# Patient Record
Sex: Female | Born: 1950 | Race: White | Hispanic: No | Marital: Married | Smoking: Current every day smoker
Health system: Southern US, Community
[De-identification: ages and names within clinical notes are randomized; demographics above are authoritative.]

## PROBLEM LIST (undated history)

## (undated) DIAGNOSIS — F419 Anxiety disorder, unspecified: Secondary | ICD-10-CM

## (undated) DIAGNOSIS — M069 Rheumatoid arthritis, unspecified: Secondary | ICD-10-CM

## (undated) DIAGNOSIS — F32A Depression, unspecified: Secondary | ICD-10-CM

## (undated) DIAGNOSIS — F329 Major depressive disorder, single episode, unspecified: Secondary | ICD-10-CM

## (undated) DIAGNOSIS — I83003 Varicose veins of unspecified lower extremity with ulcer of ankle: Secondary | ICD-10-CM

## (undated) DIAGNOSIS — I10 Essential (primary) hypertension: Secondary | ICD-10-CM

## (undated) DIAGNOSIS — J45909 Unspecified asthma, uncomplicated: Secondary | ICD-10-CM

## (undated) HISTORY — PX: TUBAL LIGATION: SHX77

## (undated) HISTORY — PX: TONSILLECTOMY: SUR1361

## (undated) HISTORY — PX: CARDIAC CATHETERIZATION: SHX172

## (undated) HISTORY — PX: APPENDECTOMY: SHX54

## (undated) HISTORY — PX: VENOUS ABLATION: SHX2656

---
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
## 2007-05-10 HISTORY — PX: COLONOSCOPY: SHX174

## 2009-09-06 HISTORY — PX: COLONOSCOPY: SHX174

## 2019-10-10 ENCOUNTER — Emergency Department (HOSPITAL_COMMUNITY): Payer: Medicare Other

## 2019-10-10 ENCOUNTER — Inpatient Hospital Stay (HOSPITAL_COMMUNITY): Payer: Medicare Other

## 2019-10-10 ENCOUNTER — Other Ambulatory Visit: Payer: Self-pay

## 2019-10-10 ENCOUNTER — Encounter (HOSPITAL_COMMUNITY): Payer: Self-pay | Admitting: Emergency Medicine

## 2019-10-10 ENCOUNTER — Inpatient Hospital Stay (HOSPITAL_COMMUNITY)
Admission: EM | Admit: 2019-10-10 | Discharge: 2019-11-07 | DRG: 871 | Disposition: E | Payer: Medicare Other | Attending: Internal Medicine | Admitting: Internal Medicine

## 2019-10-10 DIAGNOSIS — R627 Adult failure to thrive: Secondary | ICD-10-CM | POA: Diagnosis not present

## 2019-10-10 DIAGNOSIS — Z681 Body mass index (BMI) 19 or less, adult: Secondary | ICD-10-CM

## 2019-10-10 DIAGNOSIS — R400 Somnolence: Secondary | ICD-10-CM | POA: Diagnosis present

## 2019-10-10 DIAGNOSIS — E611 Iron deficiency: Secondary | ICD-10-CM | POA: Diagnosis present

## 2019-10-10 DIAGNOSIS — I5031 Acute diastolic (congestive) heart failure: Secondary | ICD-10-CM | POA: Diagnosis present

## 2019-10-10 DIAGNOSIS — M79606 Pain in leg, unspecified: Secondary | ICD-10-CM

## 2019-10-10 DIAGNOSIS — I110 Hypertensive heart disease with heart failure: Secondary | ICD-10-CM | POA: Diagnosis present

## 2019-10-10 DIAGNOSIS — M069 Rheumatoid arthritis, unspecified: Secondary | ICD-10-CM | POA: Diagnosis present

## 2019-10-10 DIAGNOSIS — M79605 Pain in left leg: Secondary | ICD-10-CM

## 2019-10-10 DIAGNOSIS — A419 Sepsis, unspecified organism: Secondary | ICD-10-CM | POA: Diagnosis not present

## 2019-10-10 DIAGNOSIS — L97929 Non-pressure chronic ulcer of unspecified part of left lower leg with unspecified severity: Secondary | ICD-10-CM | POA: Diagnosis present

## 2019-10-10 DIAGNOSIS — Z20822 Contact with and (suspected) exposure to covid-19: Secondary | ICD-10-CM | POA: Diagnosis present

## 2019-10-10 DIAGNOSIS — L03115 Cellulitis of right lower limb: Secondary | ICD-10-CM | POA: Diagnosis present

## 2019-10-10 DIAGNOSIS — G8929 Other chronic pain: Secondary | ICD-10-CM | POA: Diagnosis present

## 2019-10-10 DIAGNOSIS — F329 Major depressive disorder, single episode, unspecified: Secondary | ICD-10-CM | POA: Diagnosis present

## 2019-10-10 DIAGNOSIS — E785 Hyperlipidemia, unspecified: Secondary | ICD-10-CM | POA: Diagnosis present

## 2019-10-10 DIAGNOSIS — Z515 Encounter for palliative care: Secondary | ICD-10-CM | POA: Diagnosis not present

## 2019-10-10 DIAGNOSIS — J690 Pneumonitis due to inhalation of food and vomit: Secondary | ICD-10-CM | POA: Diagnosis present

## 2019-10-10 DIAGNOSIS — R0603 Acute respiratory distress: Secondary | ICD-10-CM

## 2019-10-10 DIAGNOSIS — Z79899 Other long term (current) drug therapy: Secondary | ICD-10-CM

## 2019-10-10 DIAGNOSIS — K921 Melena: Secondary | ICD-10-CM | POA: Diagnosis not present

## 2019-10-10 DIAGNOSIS — J9601 Acute respiratory failure with hypoxia: Secondary | ICD-10-CM | POA: Diagnosis present

## 2019-10-10 DIAGNOSIS — I361 Nonrheumatic tricuspid (valve) insufficiency: Secondary | ICD-10-CM | POA: Diagnosis not present

## 2019-10-10 DIAGNOSIS — K922 Gastrointestinal hemorrhage, unspecified: Secondary | ICD-10-CM | POA: Insufficient documentation

## 2019-10-10 DIAGNOSIS — J441 Chronic obstructive pulmonary disease with (acute) exacerbation: Secondary | ICD-10-CM | POA: Diagnosis not present

## 2019-10-10 DIAGNOSIS — E43 Unspecified severe protein-calorie malnutrition: Secondary | ICD-10-CM | POA: Diagnosis present

## 2019-10-10 DIAGNOSIS — R6510 Systemic inflammatory response syndrome (SIRS) of non-infectious origin without acute organ dysfunction: Secondary | ICD-10-CM

## 2019-10-10 DIAGNOSIS — D473 Essential (hemorrhagic) thrombocythemia: Secondary | ICD-10-CM | POA: Diagnosis not present

## 2019-10-10 DIAGNOSIS — E0590 Thyrotoxicosis, unspecified without thyrotoxic crisis or storm: Secondary | ICD-10-CM | POA: Diagnosis present

## 2019-10-10 DIAGNOSIS — K5790 Diverticulosis of intestine, part unspecified, without perforation or abscess without bleeding: Secondary | ICD-10-CM | POA: Diagnosis present

## 2019-10-10 DIAGNOSIS — I2781 Cor pulmonale (chronic): Secondary | ICD-10-CM | POA: Diagnosis present

## 2019-10-10 DIAGNOSIS — R64 Cachexia: Secondary | ICD-10-CM | POA: Diagnosis present

## 2019-10-10 DIAGNOSIS — Z66 Do not resuscitate: Secondary | ICD-10-CM | POA: Diagnosis not present

## 2019-10-10 DIAGNOSIS — F17210 Nicotine dependence, cigarettes, uncomplicated: Secondary | ICD-10-CM | POA: Diagnosis present

## 2019-10-10 DIAGNOSIS — Z86010 Personal history of colonic polyps: Secondary | ICD-10-CM

## 2019-10-10 DIAGNOSIS — J181 Lobar pneumonia, unspecified organism: Secondary | ICD-10-CM | POA: Diagnosis not present

## 2019-10-10 DIAGNOSIS — D62 Acute posthemorrhagic anemia: Secondary | ICD-10-CM | POA: Diagnosis present

## 2019-10-10 DIAGNOSIS — D75839 Thrombocytosis, unspecified: Secondary | ICD-10-CM

## 2019-10-10 DIAGNOSIS — Z800 Family history of malignant neoplasm of digestive organs: Secondary | ICD-10-CM | POA: Diagnosis not present

## 2019-10-10 DIAGNOSIS — R54 Age-related physical debility: Secondary | ICD-10-CM | POA: Diagnosis present

## 2019-10-10 DIAGNOSIS — Z7189 Other specified counseling: Secondary | ICD-10-CM | POA: Diagnosis not present

## 2019-10-10 HISTORY — DX: Varicose veins of unspecified lower extremity with ulcer of ankle: I83.003

## 2019-10-10 HISTORY — DX: Essential (primary) hypertension: I10

## 2019-10-10 HISTORY — DX: Depression, unspecified: F32.A

## 2019-10-10 HISTORY — DX: Rheumatoid arthritis, unspecified: M06.9

## 2019-10-10 HISTORY — DX: Anxiety disorder, unspecified: F41.9

## 2019-10-10 HISTORY — DX: Unspecified asthma, uncomplicated: J45.909

## 2019-10-10 LAB — BASIC METABOLIC PANEL
Anion gap: 11 (ref 5–15)
BUN: 27 mg/dL — ABNORMAL HIGH (ref 8–23)
CO2: 24 mmol/L (ref 22–32)
Calcium: 8.1 mg/dL — ABNORMAL LOW (ref 8.9–10.3)
Chloride: 104 mmol/L (ref 98–111)
Creatinine, Ser: 0.34 mg/dL — ABNORMAL LOW (ref 0.44–1.00)
GFR calc Af Amer: >60 mL/min (ref >60)
GFR calc non Af Amer: >60 mL/min (ref >60)
Glucose, Bld: 87 mg/dL (ref 70–99)
Potassium: 3.7 mmol/L (ref 3.5–5.1)
Sodium: 139 mmol/L (ref 135–145)

## 2019-10-10 LAB — CBC WITH DIFFERENTIAL/PLATELET
Abs Immature Granulocytes: 0.11 10*3/uL — ABNORMAL HIGH (ref 0.00–0.07)
Basophils Absolute: 0 10*3/uL (ref 0.0–0.1)
Basophils Relative: 0 %
Eosinophils Absolute: 0 10*3/uL (ref 0.0–0.5)
Eosinophils Relative: 0 %
HCT: 20.5 % — ABNORMAL LOW (ref 36.0–46.0)
Hemoglobin: 6.9 g/dL — CL (ref 12.0–15.0)
Immature Granulocytes: 1 %
Lymphocytes Relative: 3 %
Lymphs Abs: 0.7 10*3/uL (ref 0.7–4.0)
MCH: 31.8 pg (ref 26.0–34.0)
MCHC: 33.7 g/dL (ref 30.0–36.0)
MCV: 94.5 fL (ref 80.0–100.0)
Monocytes Absolute: 0.4 10*3/uL (ref 0.1–1.0)
Monocytes Relative: 2 %
Neutro Abs: 18.9 10*3/uL — ABNORMAL HIGH (ref 1.7–7.7)
Neutrophils Relative %: 94 %
Platelets: 472 10*3/uL — ABNORMAL HIGH (ref 150–400)
RBC: 2.17 MIL/uL — ABNORMAL LOW (ref 3.87–5.11)
RDW: 16.2 % — ABNORMAL HIGH (ref 11.5–15.5)
WBC: 20.2 10*3/uL — ABNORMAL HIGH (ref 4.0–10.5)
nRBC: 0 % (ref 0.0–0.2)

## 2019-10-10 LAB — SARS CORONAVIRUS 2 BY RT PCR (HOSPITAL ORDER, PERFORMED IN ~~LOC~~ HOSPITAL LAB): SARS Coronavirus 2: NEGATIVE

## 2019-10-10 LAB — URINALYSIS, COMPLETE (UACMP) WITH MICROSCOPIC
Bilirubin Urine: NEGATIVE
Glucose, UA: NEGATIVE mg/dL
Hgb urine dipstick: NEGATIVE
Ketones, ur: NEGATIVE mg/dL
Leukocytes,Ua: NEGATIVE
Nitrite: POSITIVE — AB
Protein, ur: NEGATIVE mg/dL
Specific Gravity, Urine: 1.023 (ref 1.005–1.030)
pH: 5 (ref 5.0–8.0)

## 2019-10-10 LAB — T4, FREE: Free T4: 1.2 ng/dL — ABNORMAL HIGH (ref 0.61–1.12)

## 2019-10-10 LAB — PROTIME-INR
INR: 1.2 (ref 0.8–1.2)
Prothrombin Time: 14.8 seconds (ref 11.4–15.2)

## 2019-10-10 LAB — HEPATIC FUNCTION PANEL
ALT: 32 U/L (ref 0–44)
AST: 34 U/L (ref 15–41)
Albumin: 2.4 g/dL — ABNORMAL LOW (ref 3.5–5.0)
Alkaline Phosphatase: 92 U/L (ref 38–126)
Bilirubin, Direct: 0.2 mg/dL (ref 0.0–0.2)
Indirect Bilirubin: 0.5 mg/dL (ref 0.3–0.9)
Total Bilirubin: 0.7 mg/dL (ref 0.3–1.2)
Total Protein: 5.3 g/dL — ABNORMAL LOW (ref 6.5–8.1)

## 2019-10-10 LAB — PROCALCITONIN: Procalcitonin: 0.16 ng/mL

## 2019-10-10 LAB — PREPARE RBC (CROSSMATCH)

## 2019-10-10 LAB — FOLATE: Folate: 13.4 ng/mL (ref >5.9)

## 2019-10-10 LAB — VITAMIN B12: Vitamin B-12: >7500 pg/mL — ABNORMAL HIGH (ref 180–914)

## 2019-10-10 LAB — BRAIN NATRIURETIC PEPTIDE: B Natriuretic Peptide: 702 pg/mL — ABNORMAL HIGH (ref 0.0–100.0)

## 2019-10-10 LAB — MRSA PCR SCREENING: MRSA by PCR: NEGATIVE

## 2019-10-10 LAB — IRON AND TIBC
Iron: 6 ug/dL — ABNORMAL LOW (ref 28–170)
Saturation Ratios: 3 % — ABNORMAL LOW (ref 10.4–31.8)
TIBC: 222 ug/dL — ABNORMAL LOW (ref 250–450)
UIBC: 216 ug/dL

## 2019-10-10 LAB — ABO/RH: ABO/RH(D): O POS

## 2019-10-10 LAB — APTT: aPTT: 40 seconds — ABNORMAL HIGH (ref 24–36)

## 2019-10-10 LAB — FERRITIN: Ferritin: 47 ng/mL (ref 11–307)

## 2019-10-10 LAB — POC OCCULT BLOOD, ED: Fecal Occult Bld: POSITIVE — AB

## 2019-10-10 LAB — TSH: TSH: 0.071 u[IU]/mL — ABNORMAL LOW (ref 0.350–4.500)

## 2019-10-10 LAB — LACTIC ACID, PLASMA: Lactic Acid, Venous: 1.4 mmol/L (ref 0.5–1.9)

## 2019-10-10 MED ORDER — SODIUM CHLORIDE 0.9% IV SOLUTION: Freq: Once | INTRAVENOUS | Status: AC

## 2019-10-10 MED ORDER — SODIUM CHLORIDE 0.9 % IV SOLN
80.0000 mg | Freq: Once | INTRAVENOUS | Status: AC
  Administered 2019-10-10: 80 mg via INTRAVENOUS
  Filled 2019-10-10: qty 80

## 2019-10-10 MED ORDER — VANCOMYCIN HCL 500 MG/100ML IV SOLN
500.0000 mg | INTRAVENOUS | Status: DC
  Filled 2019-10-10 (×4): qty 100

## 2019-10-10 MED ORDER — ONDANSETRON HCL 4 MG/2ML IJ SOLN: 4.0000 mg | Freq: Four times a day (QID) | INTRAMUSCULAR | Status: DC | PRN

## 2019-10-10 MED ORDER — PANTOPRAZOLE SODIUM 40 MG IV SOLR
40.0000 mg | Freq: Two times a day (BID) | INTRAVENOUS | Status: DC
  Filled 2019-10-10: qty 40

## 2019-10-10 MED ORDER — LACTATED RINGERS IV BOLUS
1000.0000 mL | Freq: Once | INTRAVENOUS | Status: AC
  Administered 2019-10-10: 1000 mL via INTRAVENOUS

## 2019-10-10 MED ORDER — ACETAMINOPHEN 650 MG RE SUPP
650.0000 mg | Freq: Four times a day (QID) | RECTAL | Status: DC | PRN
  Administered 2019-10-13 – 2019-10-15 (×4): 650 mg via RECTAL
  Filled 2019-10-10 (×4): qty 1

## 2019-10-10 MED ORDER — HYDROCODONE-ACETAMINOPHEN 5-325 MG PO TABS
1.0000 | ORAL_TABLET | ORAL | Status: DC | PRN
  Administered 2019-10-10 – 2019-10-13 (×11): 1 via ORAL
  Filled 2019-10-10 (×11): qty 1

## 2019-10-10 MED ORDER — SODIUM CHLORIDE 0.9 % IV BOLUS
1000.0000 mL | Freq: Once | INTRAVENOUS | Status: AC
  Administered 2019-10-10: 1000 mL via INTRAVENOUS

## 2019-10-10 MED ORDER — POTASSIUM CHLORIDE IN NACL 20-0.9 MEQ/L-% IV SOLN
INTRAVENOUS | Status: DC
  Filled 2019-10-10: qty 1000

## 2019-10-10 MED ORDER — SODIUM CHLORIDE 0.9 % IV SOLN: 10.0000 mL/h | Freq: Once | INTRAVENOUS | Status: DC

## 2019-10-10 MED ORDER — VANCOMYCIN HCL IN DEXTROSE 1-5 GM/200ML-% IV SOLN
1000.0000 mg | Freq: Once | INTRAVENOUS | Status: AC
  Administered 2019-10-10: 1000 mg via INTRAVENOUS
  Filled 2019-10-10: qty 200

## 2019-10-10 MED ORDER — SODIUM CHLORIDE 0.9 % IV SOLN
8.0000 mg/h | INTRAVENOUS | Status: AC
  Administered 2019-10-10 – 2019-10-13 (×7): 8 mg/h via INTRAVENOUS
  Filled 2019-10-10 (×10): qty 80

## 2019-10-10 MED ORDER — ACETAMINOPHEN 325 MG PO TABS: 650.0000 mg | ORAL_TABLET | Freq: Four times a day (QID) | ORAL | Status: DC | PRN

## 2019-10-10 MED ORDER — SODIUM CHLORIDE 0.9 % IV SOLN
2.0000 g | Freq: Once | INTRAVENOUS | Status: AC
  Administered 2019-10-10: 2 g via INTRAVENOUS
  Filled 2019-10-10: qty 2

## 2019-10-10 MED ORDER — ONDANSETRON HCL 4 MG PO TABS: 4.0000 mg | ORAL_TABLET | Freq: Four times a day (QID) | ORAL | Status: DC | PRN

## 2019-10-10 NOTE — ED Notes (Signed)
Pt transported to xray 

## 2019-10-10 NOTE — Consult Note (Signed)
[facility] Nurse wound consult note Consultation was completed by review of records, images and assistance from the bedside nurse/clinical staff.  Reason for Consult: leg wound  Wound type: full thickness ulceration LLE, appears chronic Pressure Injury POA: NA Measurement: see nursing flow sheet  Wound [id number], non granular, minimal slough at 11 o'clock. Tendon like structure in the wound bed    Drainage (amount, consistency, odor) moderate on dressing in image appears thick, yellow Periwound: intact, epibole of the wound edges Dressing procedure/placement/frequency: Clean wound with saline, pat dry Cut to fit silver hydrofiber and place in wound bed Top with ABD pads, secure with kerlix and ACE wrap with no tension   Re consult if needed, will not follow at this time. Thanks  Norah Devin [employer], RN,CWOCN, CNS, [place] [phone])

## 2019-10-10 NOTE — H&P (Signed)
History and Physical  [place] Orcutt [id number] DOB: May 26, 1950 DOA: 11/06/2019   PCP: Josem Kaufmann, MD   Patient coming from: Home  Chief Complaint: generalized weakness  HPI:  Sheri Espinoza is a 69 y.o. female with medical history of anxiety/depression, rheumatoid arthritis, diverticulosis, hypertension, COPD, depression presenting with failure to thrive symptoms for the past 2 weeks.  History is supplemented from the patient's spouse at the bedside.  He states that the patient has had fatigue, decline in functional status, decreased oral intake for the better part of the month.  She has lost 15 pounds in the past month.  He states that the patient previously was able to perform all her ADLs without any assistive devices.  However in the past 2 weeks, she has had a significant functional decline resulting in requiring assistance with her activities of daily living and making transfers.  The patient has been having loose stools that has been melanotic.  Apparently, the patient has had a history of loose stools in the past.  It is unclear the duration of her melanotic stools.  There is no hematochezia.  There is no history of fevers, chills, chest pain, shortness breath, coughing, hemoptysis, abdominal pain, dysuria, hematuria.  However, the patient has had decreased oral intake and unsteady gait and generalized weakness.  There is no headaches, visual disturbance, syncope.  She has not been placed on any antibiotics recently.  She takes methotrexate every Friday for her rheumatoid arthritis.  She continues to smoke 1 pack/day with a 40-pack-year history.  According to the patient's spouse, patient has not been on any new medications although she does take Imodium on a as needed basis. The patient's family called EMS on the evening of 6-21.  However, the patient refused to go to the emergency department.  She went to see her PCP on the morning of 10/14/2019.  Routine blood work was performed and  showed WBC 20,000 and hemoglobin 7.  The patient was contacted and directed to go to the emergency department. In the emergency department, the patient was afebrile with soft blood pressures.  Systolic blood pressures were in the mid 90s.  Oxygen saturation was 98% on 4 L.  Chest x-ray showed increased interstitial markings.  BMP showed sodium 139, potassium 3.7, CO2 24, serum creatinine 0.34.  WBC 20.0, hemoglobin 6.9, platelets 172,000.  Hemoccult was positive with brown/dark stool.  EKG shows sinus rhythm with nonspecific T wave changes.  Assessment/Plan: SIRS -Concerned about underlying infection -Follow blood cultures -Check lactic acid -Check procalcitonin -Obtain UA and urine culture -Start empiric vancomycin and cefepime -continue IVF  Acute blood loss anemia -No prior records to clarify the patient's hemoglobin baseline -GI consult -Transfuse PRBC--1 additional unit in addition to the 1 unit ordered in the ED -Continue Protonix drip -Check coags  Acute respiratory failure with hypoxia -due to underlying COPD, possible pulmonary edema -CT chest -on 4 LNC  Failure to thrive -Serum 123456 -Folic acid -TSH -PT eval  Left lower extremity ulceration -Wound care consult -See pictures below  Thrombocytosis -Due to combination of acute phase reaction as well as likely iron deficiency -Check iron studies  Rheumatoid arthritis -Holding methotrexate in the setting of possible sepsis        Past Medical History:  Diagnosis Date  . Anxiety   . Asthma   . Depression   . Hypertension   . Rheumatoid arthritis ([place])   . Venous ulcer of ankle ([facility])    Past  Surgical History:  Procedure Laterality Date  . APPENDECTOMY    . CARDIAC CATHETERIZATION    . TONSILLECTOMY    . TUBAL LIGATION    . VENOUS ABLATION     Social History:  reports that she has been smoking. She has never used smokeless tobacco. She reports that she does not drink alcohol or use drugs.   Family  history--unobtainable due to patient's somnolence    Prior to Admission medications   Not on File    Review of Systems:  Constitutional:  No night sweats,, fatigue.  Head&Eyes: No headache.  No vision loss.  No eye pain or scotoma ENT:  No Difficulty swallowing,Tooth/dental problems,Sore throat,  No ear ache, post nasal drip,  Cardio-vascular:  No chest pain, Orthopnea, PND,dizziness, palpitations  GI:  No  abdominal pain, vomiting, loss of appetite, hematochezia, melena, heartburn, indigestion, Resp:  No shortness of breath with exertion or at rest. No cough. No coughing up of blood .No wheezing.No chest wall deformity  Skin:  no rash or lesions.  GU:  no dysuria, change in color of urine, no urgency or frequency. No flank pain.  Musculoskeletal:  No joint pain or swelling. No decreased range of motion. No back pain.  Psych:  No change in mood or affect. No depression or anxiety. Neurologic: No headache, no dysesthesia, no focal weakness, no vision loss. No syncope  Physical Exam: Vitals:   10/14/2019 1252 10/20/2019 1254 10/31/2019 1300 10/24/2019 1330  BP: 99/64  (!) 95/54 (!) 97/48  Pulse:  (!) 58 (!) 58 (!) 59  Resp: 15 15 13 16   Temp:      TempSrc:      SpO2:  92% 97% 97%  Weight:      Height:       General:  A&O x 3, NAD, nontoxic, pleasant/cooperative Head/Eye: No conjunctival hemorrhage, no icterus, [place]/AT, No nystagmus ENT:  No icterus,  No thrush, good dentition, no pharyngeal exudate Neck:  No masses, no lymphadenpathy, no bruits CV:  RRR, no rub, no gallop, no S3 Lung:  Diminished BS.  Bibasilar rales. No wheeze Abdomen: soft/NT, +BS, nondistended, no peritoneal signs Ext: No cyanosis, No rashes, No petechiae, No lymphangitis, 2 + LE edema; LLE ulcer without necrosis Neuro: CNII-XII intact, strength 4/5 in bilateral upper and lower extremities, no dysmetria          Labs on Admission:  Basic Metabolic Panel: Recent Labs  Lab 10/09/2019 1042  NA  139  K 3.7  CL 104  CO2 24  GLUCOSE 87  BUN 27*  CREATININE 0.34*  CALCIUM 8.1*   Liver Function Tests: Recent Labs  Lab 10/25/2019 1042  AST 34  ALT 32  ALKPHOS 92  BILITOT 0.7  PROT 5.3*  ALBUMIN 2.4*   No results for input(s): LIPASE, AMYLASE in the last 168 hours. No results for input(s): AMMONIA in the last 168 hours. CBC: Recent Labs  Lab 10/11/2019 1042  WBC 20.2*  NEUTROABS 18.9*  HGB 6.9*  HCT 20.5*  MCV 94.5  PLT 472*   Coagulation Profile: No results for input(s): INR, PROTIME in the last 168 hours. Cardiac Enzymes: No results for input(s): CKTOTAL, CKMB, CKMBINDEX, TROPONINI in the last 168 hours. BNP: Invalid input(s): POCBNP CBG: No results for input(s): GLUCAP in the last 168 hours. Urine analysis: No results found for: COLORURINE, APPEARANCEUR, LABSPEC, PHURINE, GLUCOSEU, HGBUR, BILIRUBINUR, KETONESUR, PROTEINUR, UROBILINOGEN, NITRITE, LEUKOCYTESUR Sepsis Labs: @LABRCNTIP (procalcitonin:4,lacticidven:4) ) Recent Results (from the past 240 hour(s))  Culture, blood (single)  w Reflex to ID Panel     Status: None (Preliminary result)   Collection Time: 11/01/2019 10:59 AM   Specimen: BLOOD RIGHT FOREARM  Result Value Ref Range Status   Specimen Description BLOOD RIGHT FOREARM  Final   Special Requests   Final    BOTTLES DRAWN AEROBIC AND ANAEROBIC Blood Culture adequate volume Performed at [facility], [address], [place], [place] [address]    Culture PENDING  Incomplete   Report Status PENDING  Incomplete     Radiological Exams on Admission: DG Chest 2 View  Result Date: 10/08/2019 CLINICAL DATA:  Weakness. EXAM: CHEST - 2 VIEW COMPARISON:  None. FINDINGS: The heart size and mediastinal contours are within normal limits. Atherosclerosis of thoracic aorta is noted. No pneumothorax is noted. Central pulmonary vascular congestion is noted with possible bilateral pulmonary edema. Small pleural effusions are noted. The visualized skeletal structures  are unremarkable. IMPRESSION: Central pulmonary vascular congestion may be present with possible bilateral pulmonary edema and small pleural effusions. Electronically Signed   By: Marijo Conception M.D.   On: 10/11/2019 11:50    EKG: Independently reviewed. Sinus, nonspecific TWI    Time spent:70 minutes Code Status:   FULL Family Communication:  No Family at bedside Disposition Plan: expect 2-3 day hospitalization Consults called: GI DVT Prophylaxis:  SCDs  Orson Eva, DO  [facility] Pager [phone]  If 7PM-7AM, please contact night-coverage [email or web address] Password [email or web address] 11/03/2019, 1:41 PM

## 2019-10-10 NOTE — ED Notes (Signed)
Date and time results received: 11/01/2019 1131 (use smartphrase ".now" to insert current time)  Test: Hemoglobin Critical Value: 6.9 Name of Provider Notified: Dr Sedonia Small  Orders Received? Or Actions Taken?: NA

## 2019-10-10 NOTE — Consult Note (Addendum)
Referring Provider: Orson Eva, MD Primary Care Physician:  Josem Kaufmann, MD Primary Gastroenterologist:  Dr. Roni Bread [facility]  Reason for Consultation:  Acute blood loss anemia, melena  HPI: Sheri Espinoza is a 69 y.o. female with history of rheumatoid arthritis, diverticulosis, COPD, hypertension, chronic leg wound presented to the emergency department with increased weakness,  poor oral intake, abnormal labs.   Patient does provide some of her history however husband assists with elaboration.  2 weeks ago patient was taking care of all ADLs, cooking, driving.  However over the past couple weeks she has had a steady significant decline requiring 24-hour assistance especially the last several days.  Complains of generalized weakness, poor oral intake, weight loss, dehydration.  She was seen by PCP today, referred to the ER after noted to have leukocytosis and anemia.  In the ED: Pulse of 69, BP 100/53, afebrile, O2 sats initially 94% on room air.  White blood cell count 20,200, hemoglobin 6.9, hematocrit 20.5, MCV 94.5, platelets 172,000.  BUN 27, creatinine 0.34, albumin 2.4 otherwise LFTs normal, BNP 702.  Chest x-ray showed central pulmonary vascular congestion may be present with possible bilateral pulmonary edema and small pleural effusions.  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  Has been evaluated by GI at [facility] remotely.  She denies any abdominal pain, rectal bleeding, constipation, vomiting, dysphagia.  Appetite has been poor.  She has used intermittent Aleve.  Denies any aspirin powders.  History of adenomatous colon polyps removed, diverticulosis on previous colonoscopy by Dr. Posey Pronto around 2009.  She had a colonoscopy at [facility] in May 2011 that showed sigmoid diverticulosis, random colon biopsies were negative for microscopic colitis.  Celiac serologies were negative.  COVID negative.   Prior to Admission medications   Medication Sig Start  Date End Date Taking? Authorizing Provider  fexofenadine (ALLEGRA) 180 MG tablet Take 1 tablet by mouth daily. 04/26/09  Yes [provider]  ADVAIR HFA 230-21 MCG/ACT inhaler Inhale 2 puffs into the lungs 2 (two) times daily. 09/09/19   [provider]  albuterol (VENTOLIN HFA) 108 (90 Base) MCG/ACT inhaler Inhale 2 puffs into the lungs daily. Prn for wheezing    [provider]  ALPRAZolam (XANAX) 0.5 MG tablet Take 0.5 mg by mouth 3 (three) times daily as needed. 09/26/19   [provider]  montelukast (SINGULAIR) 10 MG tablet Take 10 mg by mouth daily. 09/09/19   [provider]  PARoxetine (PAXIL) 20 MG tablet Take 20 mg by mouth daily. 09/26/19   [provider]  Avapro 150 mg daily Hydrochlorothiazide 25 mg half a tablet daily Crestor 10 mg every other day Vicodin 10/3 and 25 mg 4 times daily as needed Aleve 220 mg twice daily Methotrexate 25 mg injection per week Prednisone 5 mg 2 daily Vitamin D3 50,000 units/week B12 123456 mcg daily Folic acid 1 mg daily Calcium 500 mg plus vitamin D twice daily Simponi infusion Coq10 100 mg daily Biotin 5000 mcg daily Women's One-A-Day daily    Allergies as of 10/31/2019  . (Not on File)    Past Medical History:  Diagnosis Date  . Anxiety   . Asthma   . Depression   . Hypertension   . Rheumatoid arthritis ([place])   . Venous ulcer of ankle [facility])     Past Surgical History:  Procedure Laterality Date  . APPENDECTOMY    . CARDIAC CATHETERIZATION    . COLONOSCOPY  2009   Dr. Posey Pronto,  h/o adenomatous colon polyps, diverticulosis per epic  . TONSILLECTOMY    . TUBAL LIGATION    . VENOUS ABLATION      Family History  Problem Relation Age of Onset  . Colon cancer Paternal Grandmother     Social History   Socioeconomic History  . Marital status: Married    Spouse name: Not on file  . Number of children: Not on file  . Years of education: Not on file  . Highest education level:  Not on file  Occupational History  . Not on file  Tobacco Use  . Smoking status: Current Every Day Smoker  . Smokeless tobacco: Never Used  Substance and Sexual Activity  . Alcohol use: Never  . Drug use: Never  . Sexual activity: Not on file  Other Topics Concern  . Not on file  Social History Narrative  . Not on file   Social Determinants of Health   Financial Resource Strain:   . Difficulty of Paying Living Expenses:   Food Insecurity:   . Worried About Charity fundraiser in the Last Year:   . Arboriculturist in the Last Year:   Transportation Needs:   . Film/video editor (Medical):   Marland Kitchen Lack of Transportation (Non-Medical):   Physical Activity:   . Days of Exercise per Week:   . Minutes of Exercise per Session:   Stress:   . Feeling of Stress :   Social Connections:   . Frequency of Communication with Friends and Family:   . Frequency of Social Gatherings with Friends and Family:   . Attends Religious Services:   . Active Member of Clubs or Organizations:   . Attends Archivist Meetings:   Marland Kitchen Marital Status:   Intimate Partner Violence:   . Fear of Current or Ex-Partner:   . Emotionally Abused:   Marland Kitchen Physically Abused:   . Sexually Abused:      ROS:  General: Negative for fever, chills.  See HPI Eyes: Negative for vision changes.  ENT: Negative for hoarseness, difficulty swallowing , nasal congestion. CV: Negative for chest pain, angina, palpitations, dyspnea on exertion, peripheral edema.  Respiratory: Negative for dyspnea at rest, dyspnea on exertion, positive cough, sputum, wheezing.  GI: See history of present illness. GU:  Negative for dysuria, hematuria, urinary incontinence, urinary frequency, nocturnal urination.  MS: Negative for  low back pain.  Positive for joint pain Derm: Negative for rash or itching.  Chronic large ulceration left lower leg Neuro: Negative for weakness, abnormal sensation, seizure, frequent headaches, memory loss,  confusion.  Psych: Negative for anxiety, depression, suicidal ideation, hallucinations.  Endo: Negative for unusual weight change.  Heme: Negative for bruising or bleeding. Allergy: Negative for rash or hives.       Physical Examination: Vital signs in last 24 hours: Temp:  [98.7 F (37.1 C)] 98.7 F (37.1 C) (06/03 1025) Pulse Rate:  [58-69] 59 (06/03 1330) Resp:  [13-16] 16 (06/03 1330) BP: (94-101)/(48-64) 97/48 (06/03 1330) SpO2:  [92 %-100 %] 97 % (06/03 1330) Weight:  [40.6 kg] 40.6 kg (06/03 1024)    General: Thin frail appearing female in no acute distress.  Husband at bedside.  Coughing noted throughout exam Head: Normocephalic, atraumatic.   Eyes: Conjunctiva pink, no icterus. Mouth: Oropharyngeal mucosa moist and pink , no lesions erythema or exudate. Neck: Supple without thyromegaly, masses, or lymphadenopathy.  Lungs: Diffuse rhonchi Heart: Regular rate and rhythm, no murmurs rubs or gallops.  Abdomen:  Bowel sounds are hyperactive, nontender, nondistended, no hepatosplenomegaly or masses, no abdominal bruits or    hernia , no rebound or guarding.   Rectal: Not performed Extremities: No lower extremity edema, clubbing.  Large ulceration noted left lower extremity Neuro: Alert and oriented x 4 , grossly normal neurologically.  Skin: Warm and dry, no rash or jaundice.   Psych: Alert and cooperative, normal mood and affect.        Intake/Output from previous day: No intake/output data recorded. Intake/Output this shift: Total I/O In: 1305.4 [IV Piggyback:1305.4] Out: -   Lab Results: CBC Recent Labs    10/09/2019 1042  WBC 20.2*  HGB 6.9*  HCT 20.5*  MCV 94.5  PLT 472*   BMET Recent Labs    10/25/2019 1042  NA 139  K 3.7  CL 104  CO2 24  GLUCOSE 87  BUN 27*  CREATININE 0.34*  CALCIUM 8.1*   LFT Recent Labs    11/06/2019 1042  BILITOT 0.7  BILIDIR 0.2  IBILI 0.5  ALKPHOS 92  AST 34  ALT 32  PROT 5.3*  ALBUMIN 2.4*   TSH 0.071  Lab  Results  Component Value Date   IRON 6 (L) 10/27/2019   TIBC 222 (L) 10/13/2019   FERRITIN 47 10/09/2019   No results found for: VITAMINB12 Lab Results  Component Value Date   FOLATE 13.1 10/11/2019     Lipase No results for input(s): LIPASE in the last 72 hours.  PT/INR No results for input(s): LABPROT, INR in the last 72 hours.    Imaging Studies: DG Chest 2 View  Result Date: 10/14/2019 CLINICAL DATA:  Weakness. EXAM: CHEST - 2 VIEW COMPARISON:  None. FINDINGS: The heart size and mediastinal contours are within normal limits. Atherosclerosis of thoracic aorta is noted. No pneumothorax is noted. Central pulmonary vascular congestion is noted with possible bilateral pulmonary edema. Small pleural effusions are noted. The visualized skeletal structures are unremarkable. IMPRESSION: Central pulmonary vascular congestion may be present with possible bilateral pulmonary edema and small pleural effusions. Electronically Signed   By: Marijo Conception M.D.   On: 10/08/2019 11:50  [4 week]   Impression: 69 year old female presenting with 2-week history of quick decline with progressive weakness, poor appetite, weight loss and dehydration.  Patient was noted to have leukocytosis and anemia on outpatient labs and referred to the ED for further evaluation.  GI was consulted for acute blood loss anemia/melena.  Work-up concerning for underlying infection/SIRS.  Acute respiratory failure with hypoxia in setting of COPD, possible pulmonary edema, chest CT pending.  Anemia: Presented with hemoglobin of 6.9, normal MCV.  No baseline for comparison.  Iron/TIBC/saturations low with normal ferritin, folate.  B12 level pending.  Family reports recent black stools.  Patient has been taking Aleve a couple of times per day more recently.  She was heme positive today.  Patient's last colonoscopy in 2011 as outlined above.  She is unsure if she has ever had an upper endoscopy.  Given clinical presentation, we  need to rule out upper GI bleeding once patient is medically stable.  Plan: 1. Await chest CT findings. 2. Transfuse as needed. 3. Continue IV pantoprazole twice daily. 4. Would consider upper endoscopy once patient is medically stable and a candidate for sedation. 5. Clear liquid diet, n.p.o. after midnight.  We would like to thank you for the opportunity to participate in the care of [facility].  Laureen Ochs. Bernarda Caffey [facility] [phone] 6/3/20213:38 PM  LOS: 0 days

## 2019-10-10 NOTE — Progress Notes (Signed)
Pharmacy Antibiotic Note  Ruey Reigel is a 69 y.o. female admitted on 10/28/2019 with sepsis.  Pharmacy has been consulted for vancomycin dosing.  Plan:  Give vancomycin 1g  IV x1 dose, then vancomycin 500mg  IV q24h  Goal vancomycin trough range:   15-20  mcg/mL Pharmacy will continue to monitor renal function, vancomycin troughs as clinically appropriate,  cultures and patient progress.   Height: 4\' 11"  (149.9 cm) Weight: 40.6 kg (89 lb 9.6 oz) IBW/kg (Calculated) : 43.2  Temp (24hrs), Avg:98.7 F (37.1 C), Min:98.7 F (37.1 C), Max:98.7 F (37.1 C)  Recent Labs  Lab 10/29/2019 1042  WBC 20.2*  CREATININE 0.34*    Estimated Creatinine Clearance: 43.7 mL/min (A) (by C-G formula based on SCr of 0.34 mg/dL (L)).      Antimicrobials this admission: vancomycin 6/3 >>  cefepime 6/3 >> x1 dose in ED     Microbiology results: 6/3  [facility] x2:  6/3  UCx:   6/3 Resp PCR: SARS CoV-2 negative 6/3 MRSA PCR:    Thank you for allowing pharmacy to be a part of this patient's care.  Despina Pole 10/28/2019 2:06 PM

## 2019-10-10 NOTE — ED Notes (Signed)
ED TO INPATIENT HANDOFF REPORT  ED Nurse Name and Phone #: [phone]  S Name/Age/Gender Sheri Espinoza 69 y.o. female Room/Bed: APA16A/APA16A  Code Status   Code Status: Not on file  Home/SNF/Other Home Patient oriented to: self, place, time and situation Is this baseline? Yes   Triage Complete: Triage complete  Chief Complaint Sepsis due to undetermined organism [facility]) [A41.9]  Triage Note Pt sent from PCP for eval.  Increased weakness, not eating or drinking, and wound to her left lower leg. Labs are abnormal.    Allergies Not on File  Level of Care/Admitting Diagnosis ED Disposition    ED Disposition Condition [place]: [facility] [id number]  Level of Care: Stepdown [14]  Covid Evaluation: Asymptomatic Screening Protocol (No Symptoms)  Diagnosis: Sepsis due to undetermined organism [facility]) [id number]  Admitting Physician: TAT, DAVID Juvencus.Bun  Attending Physician: TAT, DAVID Juvencus.Bun  Estimated length of stay: past midnight tomorrow  Certification:: I certify this patient will need inpatient services for at least 2 midnights       B Medical/Surgery History Past Medical History:  Diagnosis Date  . Anxiety   . Asthma   . Depression   . Hypertension   . Rheumatoid arthritis ([place])   . Venous ulcer of ankle [facility])    Past Surgical History:  Procedure Laterality Date  . APPENDECTOMY    . CARDIAC CATHETERIZATION    . COLONOSCOPY  2009   Dr. Posey Pronto, h/o adenomatous colon polyps, diverticulosis per epic  . COLONOSCOPY  09/2009   [facility]: Sigmoid diverticulosis, random colon biopsies negative for microscopic colitis.  . TONSILLECTOMY    . TUBAL LIGATION    . VENOUS ABLATION       A IV Location/Drains/Wounds Patient Lines/Drains/Airways Status   Active Line/Drains/Airways    Name:   Placement date:   Placement time:   Site:   Days:   Peripheral IV 10/26/2019 Right Forearm   10/22/2019    1101    Forearm   less than 1   Peripheral IV  11/06/2019 Right Hand   10/21/2019    1258    Hand   less than 1          Intake/Output Last 24 hours  Intake/Output Summary (Last 24 hours) at 10/20/2019 1737 Last data filed at 10/17/2019 1547 Gross per 24 hour  Intake 2605.44 ml  Output --  Net 2605.44 ml    Labs/Imaging Results for orders placed or performed during the hospital encounter of 11/01/2019 (from the past 48 hour(s))  CBC with Differential     Status: Abnormal   Collection Time: 11/04/2019 10:42 AM  Result Value Ref Range   WBC 20.2 (H) 4.0 - 10.5 K/uL   RBC 2.17 (L) 3.87 - 5.11 MIL/uL   Hemoglobin 6.9 (LL) 12.0 - 15.0 g/dL    Comment: REPEATED TO VERIFY THIS CRITICAL RESULT HAS VERIFIED AND BEEN CALLED TO [name] BY LATISHA HENDERSON ON 06 03 2021 AT 1126, AND HAS BEEN READ BACK.     HCT 20.5 (L) 36.0 - 46.0 %   MCV 94.5 80.0 - 100.0 fL   MCH 31.8 26.0 - 34.0 pg   MCHC 33.7 30.0 - 36.0 g/dL   RDW 16.2 (H) 11.5 - 15.5 %   Platelets 472 (H) 150 - 400 K/uL   nRBC 0.0 0.0 - 0.2 %   Neutrophils Relative % 94 %   Neutro Abs 18.9 (H) 1.7 - 7.7 K/uL   Lymphocytes Relative 3 %  Lymphs Abs 0.7 0.7 - 4.0 K/uL   Monocytes Relative 2 %   Monocytes Absolute 0.4 0.1 - 1.0 K/uL   Eosinophils Relative 0 %   Eosinophils Absolute 0.0 0.0 - 0.5 K/uL   Basophils Relative 0 %   Basophils Absolute 0.0 0.0 - 0.1 K/uL   Immature Granulocytes 1 %   Abs Immature Granulocytes 0.11 (H) 0.00 - 0.07 K/uL    Comment: Performed at [facility], [address] [place], [place] [id number]  Basic metabolic panel     Status: Abnormal   Collection Time: 10/11/2019 10:42 AM  Result Value Ref Range   Sodium 139 135 - 145 mmol/L   Potassium 3.7 3.5 - 5.1 mmol/L   Chloride 104 98 - 111 mmol/L   CO2 24 22 - 32 mmol/L   Glucose, Bld 87 70 - 99 mg/dL    Comment: Glucose reference range applies only to samples taken after fasting for at least 8 hours.   BUN 27 (H) 8 - 23 mg/dL   Creatinine, Ser 0.34 (L) 0.44 - 1.00 mg/dL   Calcium 8.1 (L) 8.9 - 10.3  mg/dL   GFR calc non Af Amer >60 >60 mL/min   GFR calc Af Amer >60 >60 mL/min   Anion gap 11 5 - 15    Comment: Performed at [facility], [address] [place], [place] [address]  Type and screen [facility]     Status: None (Preliminary result)   Collection Time: 10/25/2019 10:42 AM  Result Value Ref Range   ABO/RH(D) O POS    Antibody Screen NEG    Sample Expiration 10/13/2019,2359    Unit Number [id number]    Blood Component Type RED CELLS,LR    Unit division 00    Status of Unit ISSUED    Transfusion Status OK TO TRANSFUSE    Crossmatch Result      Compatible Performed at [facility], [address], [place], [place] [address]    Unit Number [id number]    Blood Component Type RBC LR PHER1    Unit division 00    Status of Unit ALLOCATED    Transfusion Status OK TO TRANSFUSE    Crossmatch Result Compatible   Hepatic function panel     Status: Abnormal   Collection Time: 10/13/2019 10:42 AM  Result Value Ref Range   Total Protein 5.3 (L) 6.5 - 8.1 g/dL   Albumin 2.4 (L) 3.5 - 5.0 g/dL   AST 34 15 - 41 U/L   ALT 32 0 - 44 U/L   Alkaline Phosphatase 92 38 - 126 U/L   Total Bilirubin 0.7 0.3 - 1.2 mg/dL   Bilirubin, Direct 0.2 0.0 - 0.2 mg/dL   Indirect Bilirubin 0.5 0.3 - 0.9 mg/dL    Comment: Performed at [facility], [address] [place], [place] [address]  Brain natriuretic peptide     Status: Abnormal   Collection Time: 10/20/2019 10:42 AM  Result Value Ref Range   B Natriuretic Peptide 702.0 (H) 0.0 - 100.0 pg/mL    Comment: Performed at [facility], [address] [place], [place] [address]  ABO/Rh     Status: None   Collection Time: 11/05/2019 10:42 AM  Result Value Ref Range   ABO/RH(D)      [name] POS Performed at [facility], [address], [place], [place] [address]   Prepare RBC (crossmatch)     Status: None   Collection Time: 10/30/2019 10:42 AM  Result Value Ref  Range   Order Confirmation      ORDER PROCESSED BY BLOOD BANK Performed at [facility], [address], [place], [place] [address]   Culture, blood (single) w Reflex to ID Panel     Status: None (Preliminary result)   Collection Time: 11/02/2019 10:59 AM   Specimen: BLOOD RIGHT FOREARM  Result Value Ref Range   Specimen Description BLOOD RIGHT FOREARM    Special Requests      BOTTLES DRAWN AEROBIC AND ANAEROBIC Blood Culture adequate volume Performed at [facility], [address] [place], [place] [address]    Culture PENDING    Report Status PENDING   Prepare RBC (crossmatch)     Status: None   Collection Time: 10/22/2019 11:07 AM  Result Value Ref Range   Order Confirmation      ORDER PROCESSED BY BLOOD BANK Performed at [facility], [address] [place], [place] [address]   POC occult blood, ED     Status: Abnormal   Collection Time: 10/14/2019 11:52 AM  Result Value Ref Range   Fecal Occult Bld POSITIVE (A) NEGATIVE  SARS Coronavirus 2 by RT PCR (hospital order, performed in [place] hospital lab) Nasopharyngeal Nasopharyngeal Swab     Status: None   Collection Time: 10/26/2019  1:35 PM   Specimen: Nasopharyngeal Swab  Result Value Ref Range   SARS Coronavirus 2 NEGATIVE NEGATIVE    Comment: (NOTE) SARS-CoV-2 target nucleic acids are NOT DETECTED. The SARS-CoV-2 RNA is generally detectable in upper and lower respiratory specimens during the acute phase of infection. The lowest concentration of SARS-CoV-2 viral copies this assay can detect is 250 copies / mL. A negative result does not preclude SARS-CoV-2 infection and should not be used as the sole basis for treatment or other patient management decisions.  A negative result may occur with improper specimen collection / handling, submission of specimen other than nasopharyngeal swab, presence of viral mutation(s) within the areas targeted by this assay, and inadequate number of viral copies (<250 copies / mL). A negative result must be combined with clinical observations, patient history, and  epidemiological information. Fact Sheet for Patients:   StrictlyIdeas.no Fact Sheet for Healthcare Providers: BankingDealers.co.za This test is not yet approved or cleared  by the [place] FDA and has been authorized for detection and/or diagnosis of SARS-CoV-2 by FDA under an Emergency Use Authorization (EUA).  This EUA will remain in effect (meaning this test can be used) for the duration of the COVID-19 declaration under Section 564(b)(1) of the Act, 21 U.S.C. section 360bbb-3(b)(1), unless the authorization is terminated or revoked sooner. Performed at [facility], [address] [place],  [address]   Urinalysis, Complete w Microscopic     Status: Abnormal   Collection Time: 10/17/2019  1:36 PM  Result Value Ref Range   Color, Urine AMBER (A) YELLOW    Comment: BIOCHEMICALS MAY BE AFFECTED BY COLOR   APPearance HAZY (A) CLEAR   Specific Gravity, Urine 1.023 1.005 - 1.030   pH 5.0 5.0 - 8.0   Glucose, UA NEGATIVE NEGATIVE mg/dL   Hgb urine dipstick NEGATIVE NEGATIVE   Bilirubin Urine NEGATIVE NEGATIVE   Ketones, ur NEGATIVE NEGATIVE mg/dL   Protein, ur NEGATIVE NEGATIVE mg/dL   Nitrite POSITIVE (A) NEGATIVE   Leukocytes,Ua NEGATIVE NEGATIVE   RBC / HPF 0-5 0 - 5 RBC/hpf   WBC, UA 11-20 0 - 5 WBC/hpf   Bacteria, UA MANY (A) NONE SEEN   Squamous Epithelial /  LPF 0-5 0 - 5   Mucus PRESENT     Comment: Performed at [facility], [address], [place], [place] [address]  Procalcitonin - Baseline     Status: None   Collection Time: 11/05/2019  1:43 PM  Result Value Ref Range   Procalcitonin 0.16 ng/mL    Comment:        Interpretation: PCT (Procalcitonin) <= 0.5 ng/mL: Systemic infection (sepsis) is not likely. Local bacterial infection is possible. (NOTE)       Sepsis PCT Algorithm           Lower Respiratory Tract                                      Infection PCT Algorithm    ----------------------------      ----------------------------         PCT < 0.25 ng/mL                PCT < 0.10 ng/mL         Strongly encourage             Strongly discourage   discontinuation of antibiotics    initiation of antibiotics    ----------------------------     -----------------------------       PCT 0.25 - 0.50 ng/mL            PCT 0.10 - 0.25 ng/mL               OR       >80% decrease in PCT            Discourage initiation of                                            antibiotics      Encourage discontinuation           of antibiotics    ----------------------------     -----------------------------         PCT >= 0.50 ng/mL              PCT 0.26 - 0.50 ng/mL               AND        <80% decrease in PCT             Encourage initiation of                                             antibiotics       Encourage continuation           of antibiotics    ----------------------------     -----------------------------        PCT >= 0.50 ng/mL                  PCT > 0.50 ng/mL               AND         increase in PCT                  Strongly encourage  initiation of antibiotics    Strongly encourage escalation           of antibiotics                                     -----------------------------                                           PCT <= 0.25 ng/mL                                                 OR                                        > 80% decrease in PCT                                     Discontinue / Do not initiate                                             antibiotics Performed at [facility], [address], [place], [place] [address]   Protime-INR     Status: None   Collection Time: 10/09/2019  1:43 PM  Result Value Ref Range   Prothrombin Time 14.8 11.4 - 15.2 seconds   INR 1.2 0.8 - 1.2    Comment: (NOTE) INR goal varies based on device and disease states. Performed at [facility], [address], [place], [place] [address]   APTT      Status: Abnormal   Collection Time: 10/08/2019  1:43 PM  Result Value Ref Range   aPTT 40 (H) 24 - 36 seconds    Comment:        IF BASELINE aPTT IS ELEVATED, SUGGEST PATIENT RISK ASSESSMENT BE USED TO DETERMINE APPROPRIATE ANTICOAGULANT THERAPY. Performed at [facility], [address] [place], [place] [address]   Iron and TIBC     Status: Abnormal   Collection Time: 10/22/2019  1:43 PM  Result Value Ref Range   Iron 6 (L) 28 - 170 ug/dL   TIBC 222 (L) 250 - 450 ug/dL   Saturation Ratios 3 (L) 10.4 - 31.8 %   UIBC 216 ug/dL    Comment: Performed at [facility], [address], [place], [place] [address]  Ferritin     Status: None   Collection Time: 10/08/2019  1:43 PM  Result Value Ref Range   Ferritin 47 11 - 307 ng/mL    Comment: Performed at [facility], [address] [place], [place] [address]  Vitamin B12     Status: Abnormal   Collection Time: 11/06/2019  1:43 PM  Result Value Ref Range   Vitamin B-12 >7,500 (H) 180 - 914 pg/mL    Comment: RESULTS CONFIRMED BY MANUAL DILUTION Performed at [facility], [address] [place], [place] [address]   Lactic acid, plasma  Status: None   Collection Time: 11/01/2019  2:03 PM  Result Value Ref Range   Lactic Acid, Venous 1.4 0.5 - 1.9 mmol/L    Comment: Performed at [facility], [address] [place], [place] [address]  Folate     Status: None   Collection Time: 10/17/2019  2:03 PM  Result Value Ref Range   Folate 13.1 >5.9 ng/mL    Comment: Performed at [facility], [address] [place], [place] [address]  TSH     Status: Abnormal   Collection Time: 11/06/2019  2:03 PM  Result Value Ref Range   TSH 0.071 (L) 0.350 - 4.500 uIU/mL    Comment: Performed by a 3rd Generation assay with a functional sensitivity of <=0.01 uIU/mL. Performed at [facility], [address], [place], [place] [address]   T4, free     Status: Abnormal   Collection Time: 10/08/2019  2:03 PM  Result Value Ref Range   Free T4 1.20 (H) 0.61 - 1.12 ng/dL     Comment: (NOTE) Biotin ingestion may interfere with free T4 tests. If the results are inconsistent with the TSH level, previous test results, or the clinical presentation, then consider biotin interference. If needed, order repeat testing after stopping biotin. Performed at [facility], [address] [place], [place] [address]    DG Chest 2 View  Result Date: 10/13/2019 CLINICAL DATA:  Weakness. EXAM: CHEST - 2 VIEW COMPARISON:  None. FINDINGS: The heart size and mediastinal contours are within normal limits. Atherosclerosis of thoracic aorta is noted. No pneumothorax is noted. Central pulmonary vascular congestion is noted with possible bilateral pulmonary edema. Small pleural effusions are noted. The visualized skeletal structures are unremarkable. IMPRESSION: Central pulmonary vascular congestion may be present with possible bilateral pulmonary edema and small pleural effusions. Electronically Signed   By: Marijo Conception M.D.   On: 11/06/2019 11:50    Pending Labs Unresulted Labs (From admission, onward)    Start     Ordered   10/11/19 0500  Procalcitonin  Daily,   R     10/08/2019 1328   10/18/2019 1345  MRSA PCR Screening  ONCE - STAT,   STAT     10/27/2019 1341   10/11/2019 1253  Culture, Urine  Once,   STAT     10/19/2019 1252   Signed and Held  HIV Antibody (routine testing w rflx)  (HIV Antibody (Routine testing w reflex) panel)  Once,   R     Signed and Held   Signed and Held  Comprehensive metabolic panel  Tomorrow morning,   R     Signed and Held   Signed and Held  CBC  Tomorrow morning,   R     Signed and Held   Signed and Held  Cortisol-am, blood  Tomorrow morning,   R     Signed and Held          Vitals/Pain Today's Vitals   10/12/2019 1540 10/11/2019 1600 10/14/2019 1630 10/12/2019 1700  BP: (!) 96/59 (!) 97/54 (!) 95/56 (!) 97/59  Pulse: (!) 56 (!) 55 (!) 54   Resp: 18 20 18 20   Temp: 97.6 F (36.4 C)     TempSrc: Oral     SpO2: 97% 95% 98%   Weight:       Height:      PainSc:        Isolation Precautions No active isolations  Medications Medications  0.9 %  sodium chloride  infusion (10 mL/hr Intravenous Not Given 10/27/2019 1528)  pantoprazole (PROTONIX) 80 mg in sodium chloride 0.9 % 100 mL (0.8 mg/mL) infusion (8 mg/hr Intravenous New Bag/Given 11/04/2019 1349)  pantoprazole (PROTONIX) injection 40 mg (has no administration in time range)  0.9 % NaCl with KCl 20 mEq/ L  infusion ( Intravenous New Bag/Given 10/14/2019 1352)  vancomycin (VANCOREADY) IVPB 500 mg/100 mL (has no administration in time range)  sodium chloride 0.9 % bolus 1,000 mL (0 mLs Intravenous Stopped 10/16/2019 1257)  pantoprazole (PROTONIX) 80 mg in sodium chloride 0.9 % 100 mL IVPB (0 mg Intravenous Stopped 10/19/2019 1347)  ceFEPIme (MAXIPIME) 2 g in sodium chloride 0.9 % 100 mL IVPB (0 g Intravenous Stopped 11/02/2019 1414)  lactated ringers bolus 1,000 mL (0 mLs Intravenous Stopped 10/11/2019 1547)  vancomycin (VANCOCIN) IVPB 1000 mg/200 mL premix (0 mg Intravenous Stopped 10/23/2019 1521)  0.9 %  sodium chloride infusion (Manually program via Guardrails IV Fluids) ( Intravenous New Bag/Given 10/24/2019 1528)    Mobility walks with device Moderate fall risk   Focused Assessments   R Recommendations: See Admitting Provider Note  Report given to:   Additional Notes:

## 2019-10-10 NOTE — ED Triage Notes (Signed)
Pt sent from PCP for eval.  Increased weakness, not eating or drinking, and wound to her left lower leg. Labs are abnormal.

## 2019-10-10 NOTE — ED Provider Notes (Signed)
[facility] Emergency Department Provider Note MRN:  [id number]  Arrival date & time: 10/28/2019     Chief Complaint   Failure to thrive History of Present Illness   Sheri Espinoza is a 69 y.o. year-old female with a history of rheumatoid arthritis, hypertension presenting to the ED with chief complaint of failure to thrive.  Progressive functional decline over the past 1 or 2 weeks.  Severe generalized weakness, weight loss, malaise.  Denies chest pain or shortness of breath or abdominal pain, no headache.  Black stools noted today.  Sent here from PCP office in [place] with abnormal labs.  Review of Systems  A complete 10 system review of systems was obtained and all systems are negative except as noted in the HPI and PMH.   Patient's Health History    Past Medical History:  Diagnosis Date  . Anxiety   . Asthma   . Depression   . Hypertension   . Rheumatoid arthritis ([place])   . Venous ulcer of ankle [facility])     Past Surgical History:  Procedure Laterality Date  . APPENDECTOMY    . CARDIAC CATHETERIZATION    . COLONOSCOPY  2009   Dr. Posey Pronto, h/o adenomatous colon polyps, diverticulosis per epic  . COLONOSCOPY  09/2009   [facility]: Sigmoid diverticulosis, random colon biopsies negative for microscopic colitis.  . TONSILLECTOMY    . TUBAL LIGATION    . VENOUS ABLATION      Family History  Problem Relation Age of Onset  . Colon cancer Paternal Grandmother     Social History   Socioeconomic History  . Marital status: Married    Spouse name: Not on file  . Number of children: Not on file  . Years of education: Not on file  . Highest education level: Not on file  Occupational History  . Not on file  Tobacco Use  . Smoking status: Current Every Day Smoker  . Smokeless tobacco: Never Used  Substance and Sexual Activity  . Alcohol use: Never  . Drug use: Never  . Sexual activity: Not on file  Other Topics Concern  . Not on file  Social History  Narrative  . Not on file   Social Determinants of Health   Financial Resource Strain:   . Difficulty of Paying Living Expenses:   Food Insecurity:   . Worried About Charity fundraiser in the Last Year:   . Arboriculturist in the Last Year:   Transportation Needs:   . Film/video editor (Medical):   Marland Kitchen Lack of Transportation (Non-Medical):   Physical Activity:   . Days of Exercise per Week:   . Minutes of Exercise per Session:   Stress:   . Feeling of Stress :   Social Connections:   . Frequency of Communication with Friends and Family:   . Frequency of Social Gatherings with Friends and Family:   . Attends Religious Services:   . Active Member of Clubs or Organizations:   . Attends Archivist Meetings:   Marland Kitchen Marital Status:   Intimate Partner Violence:   . Fear of Current or Ex-Partner:   . Emotionally Abused:   Marland Kitchen Physically Abused:   . Sexually Abused:      Physical Exam   Vitals:   10/29/2019 1530 11/03/2019 1540  BP: (!) 92/51 (!) 96/59  Pulse: (!) 57 (!) 56  Resp: 17 18  Temp:  97.6 F (36.4 C)  SpO2: 96% 97%  CONSTITUTIONAL: Ill-appearing, cachectic, NAD NEURO:  Alert and oriented x 3, no focal deficits EYES:  eyes equal and reactive ENT/NECK:  no LAD, no JVD CARDIO: Regular rate, well-perfused, normal S1 and S2 PULM:  CTAB no wheezing or rhonchi GI/GU:  normal bowel sounds, non-distended, non-tender MSK/SPINE:  No gross deformities, 2+ pitting edema to bilateral lower extremities SKIN:  no rash, atraumatic PSYCH:  Appropriate speech and behavior  *Additional and/or pertinent findings included in MDM below  Diagnostic and Interventional Summary    EKG Interpretation  Date/Time:  Thursday October 10 2019 12:53:38 EDT Ventricular Rate:  58 PR Interval:    QRS Duration: 101 QT Interval:  421 QTC Calculation: 414 R Axis:   89 Text Interpretation: Sinus rhythm Borderline right axis deviation Confirmed by Gerlene Fee [phone]) on 11/03/2019  4:05:48 PM      Labs Reviewed  CBC WITH DIFFERENTIAL/PLATELET - Abnormal; Notable for the following components:      Result Value   WBC 20.2 (*)    RBC 2.17 (*)    Hemoglobin 6.9 (*)    HCT 20.5 (*)    RDW 16.2 (*)    Platelets 472 (*)    Neutro Abs 18.9 (*)    Abs Immature Granulocytes 0.11 (*)    All other components within normal limits  BASIC METABOLIC PANEL - Abnormal; Notable for the following components:   BUN 27 (*)    Creatinine, Ser 0.34 (*)    Calcium 8.1 (*)    All other components within normal limits  HEPATIC FUNCTION PANEL - Abnormal; Notable for the following components:   Total Protein 5.3 (*)    Albumin 2.4 (*)    All other components within normal limits  BRAIN NATRIURETIC PEPTIDE - Abnormal; Notable for the following components:   B Natriuretic Peptide 702.0 (*)    All other components within normal limits  URINALYSIS, COMPLETE (UACMP) WITH MICROSCOPIC - Abnormal; Notable for the following components:   Color, Urine AMBER (*)    APPearance HAZY (*)    Nitrite POSITIVE (*)    Bacteria, UA MANY (*)    All other components within normal limits  APTT - Abnormal; Notable for the following components:   aPTT 40 (*)    All other components within normal limits  IRON AND TIBC - Abnormal; Notable for the following components:   Iron 6 (*)    TIBC 222 (*)    Saturation Ratios 3 (*)    All other components within normal limits  TSH - Abnormal; Notable for the following components:   TSH 0.071 (*)    All other components within normal limits  POC OCCULT BLOOD, ED - Abnormal; Notable for the following components:   Fecal Occult Bld POSITIVE (*)    All other components within normal limits  CULTURE, BLOOD (SINGLE)  SARS CORONAVIRUS 2 BY RT PCR (HOSPITAL ORDER, [facility])  URINE CULTURE  MRSA PCR SCREENING  LACTIC ACID, PLASMA  PROCALCITONIN  PROTIME-INR  FERRITIN  FOLATE  VITAMIN B12  T4, FREE  TYPE AND SCREEN  ABO/RH    PREPARE RBC (CROSSMATCH)  PREPARE RBC (CROSSMATCH)    DG Chest 2 View  Final Result      Medications  0.9 %  sodium chloride infusion (10 mL/hr Intravenous Not Given 10/31/2019 1528)  pantoprazole (PROTONIX) 80 mg in sodium chloride 0.9 % 100 mL (0.8 mg/mL) infusion (8 mg/hr Intravenous New Bag/Given 10/16/2019 1349)  pantoprazole (PROTONIX) injection 40 mg (has  no administration in time range)  0.9 % NaCl with KCl 20 mEq/ L  infusion ( Intravenous New Bag/Given 10/14/2019 1352)  vancomycin (VANCOREADY) IVPB 500 mg/100 mL (has no administration in time range)  sodium chloride 0.9 % bolus 1,000 mL (0 mLs Intravenous Stopped 10/24/2019 1257)  pantoprazole (PROTONIX) 80 mg in sodium chloride 0.9 % 100 mL IVPB (0 mg Intravenous Stopped 10/23/2019 1347)  ceFEPIme (MAXIPIME) 2 g in sodium chloride 0.9 % 100 mL IVPB (0 g Intravenous Stopped 10/27/2019 1414)  lactated ringers bolus 1,000 mL (0 mLs Intravenous Stopped 10/30/2019 1547)  vancomycin (VANCOCIN) IVPB 1000 mg/200 mL premix (0 mg Intravenous Stopped 11/01/2019 1521)  0.9 %  sodium chloride infusion (Manually program via Guardrails IV Fluids) ( Intravenous New Bag/Given 10/23/2019 1528)     Procedures  /  Critical Care .Critical Care Performed by: Maudie Flakes, MD Authorized by: Maudie Flakes, MD   Critical care provider statement:    Critical care time (minutes):  32   Critical care was necessary to treat or prevent imminent or life-threatening deterioration of the following conditions: Symptomatic anemia requiring blood transfusion.   Critical care was time spent personally by me on the following activities:  Discussions with consultants, evaluation of patient's response to treatment, examination of patient, ordering and performing treatments and interventions, ordering and review of laboratory studies, ordering and review of radiographic studies, pulse oximetry, re-evaluation of patient's condition, obtaining history from patient or surrogate and review of old  charts    ED Course and Medical Decision Making  I have reviewed the triage vital signs, the nursing notes, and pertinent available records from the EMR.  Listed above are laboratory and imaging tests that I personally ordered, reviewed, and interpreted and then considered in my medical decision making (see below for details).      Concern for GI bleed, acute renal or liver failure, underlying malignancy, unclear underlying process at this time but patient is very unwell appearing, mildly hypertensive, has not been able to tolerate much p.o. over the past week.  Will provide fluids, awaiting laboratory assessment.  Labs confirm leukocytosis, critical low hemoglobin at 6.9, thrombocytosis, labs otherwise unremarkable.  I am concerned for an underlying malignancy given her failure to thrive, will admit for symptomatic anemia and further diagnostics.  Barth Kirks. [place]onia Small, [place] [email or web address]  Final Clinical Impressions(s) / ED Diagnoses     ICD-10-CM   1. Gastrointestinal hemorrhage, unspecified gastrointestinal hemorrhage type  K92.2   2. Failure to thrive in adult  R62.7     ED Discharge Orders    None       Discharge Instructions Discussed with and Provided to Patient:   Discharge Instructions   None       Maudie Flakes, MD 10/20/2019 1606

## 2019-10-11 ENCOUNTER — Inpatient Hospital Stay (HOSPITAL_COMMUNITY): Payer: Medicare Other

## 2019-10-11 DIAGNOSIS — J181 Lobar pneumonia, unspecified organism: Secondary | ICD-10-CM

## 2019-10-11 DIAGNOSIS — R627 Adult failure to thrive: Secondary | ICD-10-CM

## 2019-10-11 DIAGNOSIS — I361 Nonrheumatic tricuspid (valve) insufficiency: Secondary | ICD-10-CM

## 2019-10-11 DIAGNOSIS — A419 Sepsis, unspecified organism: Principal | ICD-10-CM

## 2019-10-11 DIAGNOSIS — K922 Gastrointestinal hemorrhage, unspecified: Secondary | ICD-10-CM

## 2019-10-11 DIAGNOSIS — J9601 Acute respiratory failure with hypoxia: Secondary | ICD-10-CM

## 2019-10-11 LAB — CBC
HCT: 26.9 % — ABNORMAL LOW (ref 36.0–46.0)
Hemoglobin: 9 g/dL — ABNORMAL LOW (ref 12.0–15.0)
MCH: 30.8 pg (ref 26.0–34.0)
MCHC: 33.5 g/dL (ref 30.0–36.0)
MCV: 92.1 fL (ref 80.0–100.0)
Platelets: 381 10*3/uL (ref 150–400)
RBC: 2.92 MIL/uL — ABNORMAL LOW (ref 3.87–5.11)
RDW: 16.1 % — ABNORMAL HIGH (ref 11.5–15.5)
WBC: 18.3 10*3/uL — ABNORMAL HIGH (ref 4.0–10.5)
nRBC: 0 % (ref 0.0–0.2)

## 2019-10-11 LAB — CORTISOL-AM, BLOOD: Cortisol - AM: 48.4 ug/dL — ABNORMAL HIGH (ref 6.7–22.6)

## 2019-10-11 LAB — BPAM RBC
Blood Product Expiration Date: 202107052359
Blood Product Expiration Date: 202107092359
ISSUE DATE / TIME: 202106031518
ISSUE DATE / TIME: 202106032123
Unit Type and Rh: 5100
Unit Type and Rh: 5100

## 2019-10-11 LAB — COMPREHENSIVE METABOLIC PANEL
ALT: 28 U/L (ref 0–44)
AST: 31 U/L (ref 15–41)
Albumin: 2 g/dL — ABNORMAL LOW (ref 3.5–5.0)
Alkaline Phosphatase: 84 U/L (ref 38–126)
Anion gap: 8 (ref 5–15)
BUN: 26 mg/dL — ABNORMAL HIGH (ref 8–23)
CO2: 21 mmol/L — ABNORMAL LOW (ref 22–32)
Calcium: 7.3 mg/dL — ABNORMAL LOW (ref 8.9–10.3)
Chloride: 114 mmol/L — ABNORMAL HIGH (ref 98–111)
Creatinine, Ser: 0.32 mg/dL — ABNORMAL LOW (ref 0.44–1.00)
GFR calc Af Amer: >60 mL/min (ref >60)
GFR calc non Af Amer: >60 mL/min (ref >60)
Glucose, Bld: 68 mg/dL — ABNORMAL LOW (ref 70–99)
Potassium: 3.8 mmol/L (ref 3.5–5.1)
Sodium: 143 mmol/L (ref 135–145)
Total Bilirubin: 0.9 mg/dL (ref 0.3–1.2)
Total Protein: 4.7 g/dL — ABNORMAL LOW (ref 6.5–8.1)

## 2019-10-11 LAB — TYPE AND SCREEN
ABO/RH(D): O POS
Antibody Screen: NEGATIVE
Unit division: 0
Unit division: 0

## 2019-10-11 LAB — ECHOCARDIOGRAM COMPLETE
Height: 59 in
Weight: 1541.46 oz

## 2019-10-11 LAB — HIV ANTIBODY (ROUTINE TESTING W REFLEX): HIV Screen 4th Generation wRfx: NONREACTIVE

## 2019-10-11 LAB — PROCALCITONIN: Procalcitonin: 0.33 ng/mL

## 2019-10-11 MED ORDER — IPRATROPIUM-ALBUTEROL 0.5-2.5 (3) MG/3ML IN SOLN
3.0000 mL | Freq: Four times a day (QID) | RESPIRATORY_TRACT | Status: DC
  Administered 2019-10-11 – 2019-10-13 (×10): 3 mL via RESPIRATORY_TRACT
  Filled 2019-10-11 (×4): qty 3
  Filled 2019-10-11: qty 6
  Filled 2019-10-11 (×6): qty 3

## 2019-10-11 MED ORDER — BUDESONIDE 0.5 MG/2ML IN SUSP
0.5000 mg | Freq: Two times a day (BID) | RESPIRATORY_TRACT | Status: DC
  Administered 2019-10-11 – 2019-10-13 (×5): 0.5 mg via RESPIRATORY_TRACT
  Filled 2019-10-11 (×6): qty 2

## 2019-10-11 MED ORDER — AZITHROMYCIN 250 MG PO TABS
500.0000 mg | ORAL_TABLET | Freq: Every day | ORAL | Status: DC
  Administered 2019-10-11 – 2019-10-13 (×3): 500 mg via ORAL
  Filled 2019-10-11 (×3): qty 2

## 2019-10-11 MED ORDER — CHLORHEXIDINE GLUCONATE CLOTH 2 % EX PADS
6.0000 | MEDICATED_PAD | Freq: Every day | CUTANEOUS | Status: DC
  Administered 2019-10-11 – 2019-10-13 (×3): 6 via TOPICAL

## 2019-10-11 MED ORDER — SODIUM CHLORIDE 0.9 % IV SOLN
2.0000 g | Freq: Two times a day (BID) | INTRAVENOUS | Status: DC
  Administered 2019-10-11 – 2019-10-12 (×3): 2 g via INTRAVENOUS
  Filled 2019-10-11 (×3): qty 2

## 2019-10-11 MED ORDER — ENSURE ENLIVE PO LIQD
237.0000 mL | Freq: Two times a day (BID) | ORAL | Status: DC
  Administered 2019-10-11 – 2019-10-12 (×2): 237 mL via ORAL

## 2019-10-11 NOTE — Care Management Important Message (Signed)
Important Message  Patient Details  Name: Sheri Espinoza MRN: [id number] Date of Birth: 1950/07/15   Medicare Important Message Given:  Yes     Tommy Medal 10/11/2019, 2:50 PM

## 2019-10-11 NOTE — Progress Notes (Signed)
*  PRELIMINARY RESULTS* Echocardiogram 2D Echocardiogram has been performed.  Leavy Cella 10/11/2019, 12:11 PM

## 2019-10-11 NOTE — Progress Notes (Signed)
PROGRESS NOTE  Sheri Espinoza [id number] DOB: 04-17-1951 DOA: 10/09/2019 PCP: Josem Kaufmann, MD  Brief History:  y.o. female with medical history of anxiety/depression, rheumatoid arthritis, diverticulosis, hypertension, COPD, depression presenting with failure to thrive symptoms for the past 2 weeks.  History is supplemented from the patient's spouse at the bedside.  He states that the patient has had fatigue, decline in functional status, decreased oral intake for the better part of the month.  She has lost 15 pounds in the past month.  He states that the patient previously was able to perform all her ADLs without any assistive devices.  However in the past 2 weeks, she has had a significant functional decline resulting in requiring assistance with her activities of daily living and making transfers.  The patient has been having loose stools that has been melanotic.  Apparently, the patient has had a history of loose stools in the past.  It is unclear the duration of her melanotic stools.  There is no hematochezia.  There is no history of fevers, chills, chest pain, shortness breath, coughing, hemoptysis, abdominal pain, dysuria, hematuria.  However, the patient has had decreased oral intake and unsteady gait and generalized weakness.  There is no headaches, visual disturbance, syncope.  She has not been placed on any antibiotics recently.  She takes methotrexate every Friday for her rheumatoid arthritis.  She continues to smoke 1 pack/day with a 40-pack-year history.  According to the patient's spouse, patient has not been on any new medications although she does take Imodium on a as needed basis. The patient's family called EMS on the evening of 6-21.  However, the patient refused to go to the emergency department.  She went to see her PCP on the morning of 10/12/2019.  Routine blood work was performed and showed WBC 20,000 and hemoglobin 7.  The patient was contacted and directed to go to the  emergency department. In the emergency department, the patient was afebrile with soft blood pressures.  Systolic blood pressures were in the mid 90s.  Oxygen saturation was 98% on 4 L.  Chest x-ray showed increased interstitial markings.  BMP showed sodium 139, potassium 3.7, CO2 24, serum creatinine 0.34.  WBC 20.0, hemoglobin 6.9, platelets 172,000.  Hemoccult was positive with brown/dark stool.  EKG shows sinus rhythm with nonspecific T wave changes.   Assessment/Plan: Sepsis -present on admission -due to pneumonia vs urine -Follow blood and urine cultures -Check lactic acid 1,4 -Check procalcitonin 0.16>>0.33 -Obtain UA--11-20 WBC -continue empiric cefepime -add azithro -continue IVF  Acute blood loss anemia -No prior records to clarify the patient's hemoglobin baseline -GI consult appreciated-->possible endoscopy -Transfused 2 units PRBCs -Continue Protonix drip -Check coags--INR 1.2, PTT 40  Acute respiratory failure with hypoxia -due to pneumonia in setting of underlying COPD -CT chest--diffuse airspace disease RUL; patchy GGO LUL and LLL -on 4 LNC  Right leg cellulitis -continue abx as above  Failure to thrive -Serum G01 >7494 -Folic WHQP--59.1 -TSH 6.384 -PT eval  Left lower extremity ulceration -Wound care consult appreciated -See pictures on 10/20/2019  Thrombocytosis -Due to combination of acute phase reaction as well as likely iron deficiency -Check iron studies --iron sat 3%, ferritin 47  Rheumatoid arthritis -Holding methotrexate in the setting of  Sepsis  Hyperthyroidism -TSH 0.071 -Free T4 1.20 -check thyroperoxidase and thyroglobulin ab -outpt endocrine referral  COPD/tobacco abuse -start duonebs -start pulmicort      Status is: Inpatient  Remains inpatient appropriate because:IV treatments appropriate due to intensity of illness or inability to take PO   Dispo: The patient is from: Home              Anticipated d/c is to:  SNF              Anticipated d/c date is: 2 days              Patient currently is not medically stable to d/c.        Family Communication:   Spouse updated at bedside 6/4  Consultants:  GI  Code Status:  FULL   DVT Prophylaxis:  SCDs   Procedures: As Listed in Progress Note Above  Antibiotics: Cefepime 6/3>>> Azithro 6/4>>> vanc 6/3      Subjective: Pt complains of a little sob and cough.  Denies f/c, cp, n/v/d, abd pain, dysuria.  No headache or visual disturbance or neck pain.  No hemoptysis  Objective: Vitals:   10/11/19 0500 10/11/19 0600 10/11/19 0700 10/11/19 0800  BP:  (!) 120/52 (!) 118/58 118/61  Pulse:  (!) 47 (!) 49 (!) 52  Resp:  18 18 19   Temp:    97.6 F (36.4 C)  TempSrc:    Oral  SpO2:  96% 97% 94%  Weight: 43.7 kg     Height:        Intake/Output Summary (Last 24 hours) at 10/11/2019 0837 Last data filed at 10/11/2019 0500 Gross per 24 hour  Intake 3015.44 ml  Output 200 ml  Net 2815.44 ml   Weight change:  Exam:   General:  Pt is alert, follows commands appropriately, not in acute distress  HEENT: No icterus, No thrush, No neck mass, [place]/AT  Cardiovascular: RRR, S1/S2, no rubs, no gallops  Respiratory: bilateral rhonchi and mild exp wheeze  Abdomen: Soft/+BS, non tender, non distended, no guarding  Extremities: 1+LE edema, No lymphangitis, No petechiae, No rashes, no synovitis   Data Reviewed: I have personally reviewed following labs and imaging studies Basic Metabolic Panel: Recent Labs  Lab 10/23/2019 1042 10/11/19 0441  NA 139 143  K 3.7 3.8  CL 104 114*  CO2 24 21*  GLUCOSE 87 68*  BUN 27* 26*  CREATININE 0.34* 0.32*  CALCIUM 8.1* 7.3*   Liver Function Tests: Recent Labs  Lab 10/13/2019 1042 10/11/19 0441  AST 34 31  ALT 32 28  ALKPHOS 92 84  BILITOT 0.7 0.9  PROT 5.3* 4.7*  ALBUMIN 2.4* 2.0*   No results for input(s): LIPASE, AMYLASE in the last 168 hours. No results for input(s): AMMONIA in the last  168 hours. Coagulation Profile: Recent Labs  Lab 11/01/2019 1343  INR 1.2   CBC: Recent Labs  Lab 10/21/2019 1042 10/11/19 0441  WBC 20.2* 18.3*  NEUTROABS 18.9*  --   HGB 6.9* 9.0*  HCT 20.5* 26.9*  MCV 94.5 92.1  PLT 472* 381   Cardiac Enzymes: No results for input(s): CKTOTAL, CKMB, CKMBINDEX, TROPONINI in the last 168 hours. BNP: Invalid input(s): POCBNP CBG: No results for input(s): GLUCAP in the last 168 hours. HbA1C: No results for input(s): HGBA1C in the last 72 hours. Urine analysis:    Component Value Date/Time   COLORURINE AMBER (A) 10/25/2019 1336   APPEARANCEUR HAZY (A) 11/02/2019 1336   LABSPEC 1.023 10/18/2019 1336   PHURINE 5.0 10/22/2019 1336   GLUCOSEU NEGATIVE 10/23/2019 1336   HGBUR NEGATIVE 10/24/2019 1336   BILIRUBINUR NEGATIVE 11/06/2019 1336   KETONESUR NEGATIVE 10/30/2019  [place] 10/09/2019 1336   NITRITE POSITIVE (A) 10/28/2019 1336   LEUKOCYTESUR NEGATIVE 10/11/2019 1336   Sepsis Labs: @LABRCNTIP (procalcitonin:4,lacticidven:4) ) Recent Results (from the past 240 hour(s))  Culture, blood (single) w Reflex to ID Panel     Status: None (Preliminary result)   Collection Time: 10/14/2019 10:59 AM   Specimen: BLOOD RIGHT FOREARM  Result Value Ref Range Status   Specimen Description BLOOD RIGHT FOREARM  Final   Special Requests   Final    BOTTLES DRAWN AEROBIC AND ANAEROBIC Blood Culture adequate volume   Culture   Final    NO GROWTH < 24 HOURS Performed at [facility], [address], [place], [place] [address]    Report Status PENDING  Incomplete  SARS Coronavirus 2 by RT PCR (hospital order, performed in [place] hospital lab) Nasopharyngeal Nasopharyngeal Swab     Status: None   Collection Time: 10/16/2019  1:35 PM   Specimen: Nasopharyngeal Swab  Result Value Ref Range Status   SARS Coronavirus 2 NEGATIVE NEGATIVE Final    Comment: (NOTE) SARS-CoV-2 target nucleic acids are NOT DETECTED. The SARS-CoV-2 RNA is  generally detectable in upper and lower respiratory specimens during the acute phase of infection. The lowest concentration of SARS-CoV-2 viral copies this assay can detect is 250 copies / mL. A negative result does not preclude SARS-CoV-2 infection and should not be used as the sole basis for treatment or other patient management decisions.  A negative result may occur with improper specimen collection / handling, submission of specimen other than nasopharyngeal swab, presence of viral mutation(s) within the areas targeted by this assay, and inadequate number of viral copies (<250 copies / mL). A negative result must be combined with clinical observations, patient history, and epidemiological information. Fact Sheet for Patients:   StrictlyIdeas.no Fact Sheet for Healthcare Providers: BankingDealers.co.za This test is not yet approved or cleared  by the [place] FDA and has been authorized for detection and/or diagnosis of SARS-CoV-2 by FDA under an Emergency Use Authorization (EUA).  This EUA will remain in effect (meaning this test can be used) for the duration of the COVID-19 declaration under Section 564(b)(1) of the Act, 21 U.S.C. section 360bbb-3(b)(1), unless the authorization is terminated or revoked sooner. Performed at [facility], [address] [place], [place] [address]   MRSA PCR Screening     Status: None   Collection Time: 10/27/2019  1:45 PM   Specimen: Nasal Mucosa; Nasopharyngeal  Result Value Ref Range Status   MRSA by PCR NEGATIVE NEGATIVE Final    Comment:        The GeneXpert MRSA Assay (FDA approved for NASAL specimens only), is one component of a comprehensive MRSA colonization surveillance program. It is not intended to diagnose MRSA infection nor to guide or monitor treatment for MRSA infections. Performed at [facility], [address], [place], [place] [address]      Scheduled Meds: .  Chlorhexidine Gluconate Cloth  6 each Topical Daily  . [START ON 10/14/2019] pantoprazole  40 mg Intravenous Q12H   Continuous Infusions: . sodium chloride    . 0.9 % NaCl with KCl 20 mEq / L 100 mL/hr at 10/11/19 0039  . ceFEPime (MAXIPIME) IV    . pantoprozole (PROTONIX) infusion 8 mg/hr (10/11/19 0038)  . vancomycin      Procedures/Studies: DG Chest 2 View  Result Date: 10/23/2019 CLINICAL DATA:  Weakness. EXAM: CHEST - 2 VIEW COMPARISON:  None. FINDINGS: The heart size  and mediastinal contours are within normal limits. Atherosclerosis of thoracic aorta is noted. No pneumothorax is noted. Central pulmonary vascular congestion is noted with possible bilateral pulmonary edema. Small pleural effusions are noted. The visualized skeletal structures are unremarkable. IMPRESSION: Central pulmonary vascular congestion may be present with possible bilateral pulmonary edema and small pleural effusions. Electronically Signed   By: Marijo Conception M.D.   On: 11/01/2019 11:50   CT CHEST WO CONTRAST  Result Date: 10/30/2019 CLINICAL DATA:  Shortness of breath EXAM: CT CHEST WITHOUT CONTRAST TECHNIQUE: Multidetector CT imaging of the chest was performed following the standard protocol without IV contrast. COMPARISON:  Chest x-ray today FINDINGS: Cardiovascular: Heart is normal size. Coronary artery and aortic atherosclerosis. No evidence of aortic aneurysm. Mediastinum/Nodes: No visible adenopathy. Trachea and esophagus are unremarkable. Thyroid unremarkable. Lungs/Pleura: Small bilateral pleural effusions. Diffuse airspace disease throughout much of the right upper lobe. Patchy ground-glass airspace opacities throughout the left upper lobe with more confluent opacity in the left lower lobe. Findings concerning for pneumonia. Underlying emphysema. Upper Abdomen: Imaging into the upper abdomen shows no acute findings. Musculoskeletal: Chest wall soft tissues are unremarkable. No acute bony abnormality. IMPRESSION:  Multifocal airspace disease, most confluent in the right upper lobe and left lower lobe most compatible with multifocal pneumonia. Small bilateral pleural effusions. Coronary artery disease. Aortic Atherosclerosis (ICD10-I70.0) and Emphysema (ICD10-J43.9). Electronically Signed   By: Rolm Baptise M.D.   On: 10/16/2019 20:38    Orson Eva, DO  [facility]  If 7PM-7AM, please contact night-coverage [email or web address] Password [email or web address] 10/11/2019, 8:37 AM   LOS: 1 day

## 2019-10-11 NOTE — Progress Notes (Addendum)
Pharmacy Antibiotic Note  Frida Wahlstrom is a 69 y.o. female admitted on 10/12/2019 with sepsis.  Pharmacy has been  consulted for vancomycin and now cefepime dosing.  Plan: Start cefepime 2g IV q12h Continue  vancomycin 500mg  IV q24h  Goal vancomycin trough range:   15-20  mcg/mL Pharmacy will continue to monitor renal function, vancomycin troughs as clinically appropriate,  cultures and patient progress.   Height: 4\' 11"  (149.9 cm) Weight: 43.7 kg (96 lb 5.5 oz) IBW/kg (Calculated) : 43.2  Temp (24hrs), Avg:98 F (36.7 C), Min:97.4 F (36.3 C), Max:98.9 F (37.2 C)  Recent Labs  Lab 11/04/2019 1042 10/08/2019 1403 10/11/19 0441  WBC 20.2*  --  18.3*  CREATININE 0.34*  --  0.32*  LATICACIDVEN  --  1.4  --     Estimated Creatinine Clearance: 46.5 mL/min (A) (by C-G formula based on SCr of 0.32 mg/dL (L)).     Antimicrobials this admission: vancomycin 6/3 >>  cefepime 6/3 >>      Microbiology results: 6/3  [facility] x2:  NG<24h 6/3  UCx: sent  6/3 Resp PCR: SARS CoV-2 negative 6/3 MRSA PCR: negative   Thank you for allowing pharmacy to be a part of this patient's care.  Despina Pole 10/11/2019 8:26 AM

## 2019-10-11 NOTE — Progress Notes (Signed)
Subjective: Feeling somewhat better today, still short of breath. Denies abdominal pain, GERD symptoms, N/V. Had a bowel movement this morning but not sure what it looked like. Still feels weak and "worn out." No other overt GI complaints.  Objective: Vital signs in last 24 hours: Temp:  [97.4 F (36.3 C)-98.9 F (37.2 C)] 97.6 F (36.4 C) (06/04 0800) Pulse Rate:  [47-69] 52 (06/04 0900) Resp:  [13-23] 17 (06/04 0900) BP: (84-122)/(48-64) 96/52 (06/04 0900) SpO2:  [92 %-100 %] 94 % (06/04 0926) Weight:  [40.6 kg-43.7 kg] 43.7 kg (06/04 0500) Last BM Date: 10/26/2019 General:   Alert and oriented, pleasant Head:  Normocephalic and atraumatic. Eyes:  No icterus, sclera clear. Conjuctiva pink.  Heart:  S1, S2 present, no murmurs noted.  Lungs: Bilateral rhonchi throughout. Difficulty speaking in full sentences. On oxygen 3 lpm per nasal cannula Abdomen:  Bowel sounds present, soft, non-tender, non-distended. No HSM or hernias noted. No rebound or guarding. No masses appreciated  Msk:  Symmetrical without gross deformities. Pulses:  Normal bilateral DP pulses noted. Extremities:  Without clubbing or edema. Neurologic:  Alert and  oriented x4;  grossly normal neurologically. Skin:  Warm and dry, intact without significant lesions.  Psych:  Alert and cooperative. Normal mood and affect.  Intake/Output from previous day: 06/03 0701 - 06/04 0700 In: 3015.4 [Blood:410; IV Piggyback:2605.4] Out: 200 [Urine:200] Intake/Output this shift: No intake/output data recorded.  Lab Results: Recent Labs    10/20/2019 1042 10/11/19 0441  WBC 20.2* 18.3*  HGB 6.9* 9.0*  HCT 20.5* 26.9*  PLT 472* 381   BMET Recent Labs    10/22/2019 1042 10/11/19 0441  NA 139 143  K 3.7 3.8  CL 104 114*  CO2 24 21*  GLUCOSE 87 68*  BUN 27* 26*  CREATININE 0.34* 0.32*  CALCIUM 8.1* 7.3*   LFT Recent Labs    11/02/2019 1042 10/11/19 0441  PROT 5.3* 4.7*  ALBUMIN 2.4* 2.0*  AST 34 31  ALT 32  28  ALKPHOS 92 84  BILITOT 0.7 0.9  BILIDIR 0.2  --   IBILI 0.5  --    PT/INR Recent Labs    10/13/2019 1343  LABPROT 14.8  INR 1.2   Hepatitis Panel No results for input(s): HEPBSAG, HCVAB, HEPAIGM, HEPBIGM in the last 72 hours.   Studies/Results: DG Chest 2 View  Result Date: 10/26/2019 CLINICAL DATA:  Weakness. EXAM: CHEST - 2 VIEW COMPARISON:  None. FINDINGS: The heart size and mediastinal contours are within normal limits. Atherosclerosis of thoracic aorta is noted. No pneumothorax is noted. Central pulmonary vascular congestion is noted with possible bilateral pulmonary edema. Small pleural effusions are noted. The visualized skeletal structures are unremarkable. IMPRESSION: Central pulmonary vascular congestion may be present with possible bilateral pulmonary edema and small pleural effusions. Electronically Signed   By: Marijo Conception M.D.   On: 11/05/2019 11:50   CT CHEST WO CONTRAST  Result Date: 10/29/2019 CLINICAL DATA:  Shortness of breath EXAM: CT CHEST WITHOUT CONTRAST TECHNIQUE: Multidetector CT imaging of the chest was performed following the standard protocol without IV contrast. COMPARISON:  Chest x-ray today FINDINGS: Cardiovascular: Heart is normal size. Coronary artery and aortic atherosclerosis. No evidence of aortic aneurysm. Mediastinum/Nodes: No visible adenopathy. Trachea and esophagus are unremarkable. Thyroid unremarkable. Lungs/Pleura: Small bilateral pleural effusions. Diffuse airspace disease throughout much of the right upper lobe. Patchy ground-glass airspace opacities throughout the left upper lobe with more confluent opacity in the left lower lobe. Findings  concerning for pneumonia. Underlying emphysema. Upper Abdomen: Imaging into the upper abdomen shows no acute findings. Musculoskeletal: Chest wall soft tissues are unremarkable. No acute bony abnormality. IMPRESSION: Multifocal airspace disease, most confluent in the right upper lobe and left lower lobe  most compatible with multifocal pneumonia. Small bilateral pleural effusions. Coronary artery disease. Aortic Atherosclerosis (ICD10-I70.0) and Emphysema (ICD10-J43.9). Electronically Signed   By: Rolm Baptise M.D.   On: 10/23/2019 20:38    Assessment: 69 year old female presenting with 2-week history of quick decline with progressive weakness, poor appetite, weight loss and dehydration.  Patient was noted to have leukocytosis and anemia on outpatient labs and referred to the ED for further evaluation.  GI was consulted for acute blood loss anemia/melena.  Work-up concerning for underlying infection/SIRS.  Acute respiratory failure with hypoxia in setting of COPD, possible pulmonary edema, chest CT with multifocal pneumonia. Lactic acid normal, procalcitonin baseline and subsequent both normal. Sats 95-97% today on 3 lpm oxygen. Luncg with pan ronchi today, not able to speak in complete sentences  Anemia: Presented with hemoglobin of 6.9, normal MCV.  No baseline for comparison.  Iron/TIBC/saturations low with normal ferritin, folate.  B12 level pending.  Family reports recent black stools.  Patient has been taking Aleve a couple of times per day more recently.  She was heme positive on admission.  Patient's last colonoscopy in 2011.  She is unsure if she has ever had an upper endoscopy.  She has received 2u PRBC with improvement in hgb to 9.0 this morning. Had a bowel movement this morning, not sure if it was dark or not.   Plan: 1. Can advance diet/feed the patient 2. Continued pulmonary care per hospitalist 3. Monitor hgb closely 4. Transfuse as necessary 5. Monitor electrolytes 6. Will consider possible EGD Sunday or Monday, pending clinical progress 7. Supportive measures   Thank you for allowing [place] to participate in the care of [place], DNP, AGNP-C Adult & Gerontological Nurse Practitioner [facility]    LOS: 1 day    10/11/2019, 10:11  AM

## 2019-10-12 DIAGNOSIS — I5031 Acute diastolic (congestive) heart failure: Secondary | ICD-10-CM

## 2019-10-12 LAB — EXPECTORATED SPUTUM ASSESSMENT W GRAM STAIN, RFLX TO RESP C

## 2019-10-12 LAB — URINE CULTURE: Culture: >=100000 — AB

## 2019-10-12 LAB — MAGNESIUM: Magnesium: 1.7 mg/dL (ref 1.7–2.4)

## 2019-10-12 LAB — BASIC METABOLIC PANEL
Anion gap: 9 (ref 5–15)
BUN: 33 mg/dL — ABNORMAL HIGH (ref 8–23)
CO2: 19 mmol/L — ABNORMAL LOW (ref 22–32)
Calcium: 7.4 mg/dL — ABNORMAL LOW (ref 8.9–10.3)
Chloride: 117 mmol/L — ABNORMAL HIGH (ref 98–111)
Creatinine, Ser: 0.39 mg/dL — ABNORMAL LOW (ref 0.44–1.00)
GFR calc Af Amer: >60 mL/min (ref >60)
GFR calc non Af Amer: >60 mL/min (ref >60)
Glucose, Bld: 104 mg/dL — ABNORMAL HIGH (ref 70–99)
Potassium: 4.1 mmol/L (ref 3.5–5.1)
Sodium: 145 mmol/L (ref 135–145)

## 2019-10-12 LAB — CBC
HCT: 31 % — ABNORMAL LOW (ref 36.0–46.0)
Hemoglobin: 10.1 g/dL — ABNORMAL LOW (ref 12.0–15.0)
MCH: 30.3 pg (ref 26.0–34.0)
MCHC: 32.6 g/dL (ref 30.0–36.0)
MCV: 93.1 fL (ref 80.0–100.0)
Platelets: 494 10*3/uL — ABNORMAL HIGH (ref 150–400)
RBC: 3.33 MIL/uL — ABNORMAL LOW (ref 3.87–5.11)
RDW: 16.8 % — ABNORMAL HIGH (ref 11.5–15.5)
WBC: 23.2 10*3/uL — ABNORMAL HIGH (ref 4.0–10.5)
nRBC: 0 % (ref 0.0–0.2)

## 2019-10-12 LAB — C DIFFICILE QUICK SCREEN W PCR REFLEX
C Diff antigen: NEGATIVE
C Diff interpretation: NOT DETECTED
C Diff toxin: NEGATIVE

## 2019-10-12 LAB — PROCALCITONIN: Procalcitonin: 0.34 ng/mL

## 2019-10-12 LAB — THYROID PEROXIDASE ANTIBODY: Thyroperoxidase Ab SerPl-aCnc: <9 IU/mL (ref 0–34)

## 2019-10-12 MED ORDER — SODIUM CHLORIDE 0.9 % IV SOLN
1.0000 g | INTRAVENOUS | Status: DC
  Administered 2019-10-12: 1 g via INTRAVENOUS
  Filled 2019-10-12 (×2): qty 10

## 2019-10-12 MED ORDER — CHLORHEXIDINE GLUCONATE 0.12 % MT SOLN
15.0000 mL | Freq: Two times a day (BID) | OROMUCOSAL | Status: DC
  Administered 2019-10-12 – 2019-10-13 (×2): 15 mL via OROMUCOSAL
  Filled 2019-10-12 (×2): qty 15

## 2019-10-12 MED ORDER — ORAL CARE MOUTH RINSE
15.0000 mL | Freq: Two times a day (BID) | OROMUCOSAL | Status: DC
  Administered 2019-10-13 – 2019-10-15 (×5): 15 mL via OROMUCOSAL

## 2019-10-12 MED ORDER — FUROSEMIDE 10 MG/ML IJ SOLN
40.0000 mg | Freq: Every day | INTRAMUSCULAR | Status: DC
  Administered 2019-10-12 – 2019-10-13 (×2): 40 mg via INTRAVENOUS
  Filled 2019-10-12 (×2): qty 4

## 2019-10-12 MED ORDER — MAGNESIUM SULFATE 2 GM/50ML IV SOLN
2.0000 g | Freq: Once | INTRAVENOUS | Status: AC
  Administered 2019-10-12: 2 g via INTRAVENOUS
  Filled 2019-10-12: qty 50

## 2019-10-12 MED ORDER — METOPROLOL TARTRATE 5 MG/5ML IV SOLN
5.0000 mg | Freq: Once | INTRAVENOUS | Status: AC
  Administered 2019-10-12: 5 mg via INTRAVENOUS

## 2019-10-12 NOTE — Progress Notes (Signed)
Patient still appeared to be short of breath have transition to BiPAP. This occurred about 20:30 patient appears to be tolerating well.

## 2019-10-12 NOTE — Progress Notes (Signed)
Paged Humphrey Rolls in regard to patient HR 125-135. Awaiting response/orders at this time.

## 2019-10-12 NOTE — Progress Notes (Signed)
Initial Nutrition Assessment  DOCUMENTATION CODES:   Not applicable  INTERVENTION:  Continue Ensure Enlive po BID, each supplement provides 350 kcal and 20 grams of protein Magic cup BID with meals, each supplement provides 290 kcal and 9 grams of protein    NUTRITION DIAGNOSIS:   Increased nutrient needs related to acute illness(acute respiratory failure due to pneumonia in the setting of underlying COPD) as evidenced by estimated needs.    GOAL:   Patient will meet greater than or equal to 90% of their needs    MONITOR:   Labs, I & O's, Supplement acceptance, PO intake, Weight trends  REASON FOR ASSESSMENT:   Malnutrition Screening Tool    ASSESSMENT:  RD working remotely.  69 year old female with past medical history of RA, diverticulosis, HTN, COPD, presented with 2 week history of fatigue, decline in functional status requiring assistance with ADLs,  decreased oral intake, 15 lb wt loss over the past month, and loose melanotic stools.  Patient admitted for acute respiratory failure with hypoxia due to pneumonia in the setting of underlying COPD. Per notes patient reports worsening SOB today, on 4 L oxygen. Hemoccult positive with dark brown stool, pt is s/p 2 units PRBC. GI consulted, noted possible endoscopy.   Patient currently in ICU, unable to obtain nutrition history at this time. Per flowsheets diet advanced to soft on 6/4, no documented intakes at this time for review. Patient is provided Ensure BID per medication review, noted refusal of PM supplement. Will provide Magic Cup with lunch and dinner meals to aid with needs.  Mild pitting BLE edema noted per RN assessment Current wt 96.14 lb No recent wt history for review, last wt per care everywhere 197 lb in 2014  Medications reviewed and include:Zithromax, Lasix IV 40 mg daily IVPB: Rocephin, Protonix Labs reviewed  NUTRITION - FOCUSED PHYSICAL EXAM: Unable to complete at this time, RD working  remotely.  Diet Order:   Diet Order            DIET SOFT Room service appropriate? Yes; Fluid consistency: Thin  Diet effective now              EDUCATION NEEDS:   No education needs have been identified at this time  Skin:  Skin Assessment: Skin Integrity Issues: Skin Integrity Issues:: Other (Comment) Other: venous stasis ulcer; left ankle; MASD; buttocks, perineum  Last BM:  6/4 type 7  Height:   Ht Readings from Last 1 Encounters:  10/27/2019 4\' 11"  (1.499 m)    Weight:   Wt Readings from Last 1 Encounters:  10/11/19 43.7 kg    BMI:  Body mass index is 19.46 kg/m.  Estimated Nutritional Needs:   Kcal:  1400-1600  Protein:  70-80  Fluid:  >/= 1.1 L/day   Lajuan Lines, RD, LDN Clinical Nutrition After Hours/Weekend Pager # in [place]

## 2019-10-12 NOTE — Progress Notes (Signed)
PROGRESS NOTE  Sheri Espinoza FVC:[id number] DOB: 1950-11-29 DOA: 11/03/2019 PCP: Josem Kaufmann, MD  Brief History:  69yo.femalewith medical history ofanxiety/depression, rheumatoid arthritis, diverticulosis, hypertension, COPD, depression presenting with failure to thrive symptoms for the past 2 weeks. History is supplemented from the patient's spouse at the bedside. He states that the patient has had fatigue, decline in functional status, decreased oral intake for the better part of the month. She has lost 15 pounds in the past month. He states that the patient previously was able to perform all her ADLs without any assistive devices. However in the past 2 weeks, she has had a significant functional decline resulting in requiring assistance with her activities of daily living and making transfers. The patient has been having loose stools that has been melanotic. Apparently, the patient has had a history of loose stools in the past. It is unclear the duration of her melanotic stools. There is no hematochezia. There is no history of fevers, chills, chest pain, shortness breath, coughing, hemoptysis, abdominal pain, dysuria, hematuria. However, the patient has had decreased oral intake and unsteady gait and generalized weakness. There is no headaches, visual disturbance, syncope. She has not been placed on any antibiotics recently. She takes methotrexate every Friday for her rheumatoid arthritis. She continues to smoke 1 pack/day with a 40-pack-year history. According to the patient's spouse, patient has not been on any new medications although she does take Imodium on a as needed basis. The patient's family called EMS on the evening of 6-21. However, the patient refused to go to the emergency department. She went to see her PCP on the morning of 10/25/2019. Routine blood work was performed and showed WBC 20,000 and hemoglobin 7. The patient was contacted and directed to go to the  emergency department. In the emergency department, the patient was afebrile with soft blood pressures. Systolic blood pressures were in the mid 90s. Oxygen saturation was 98% on 4 L. Chest x-ray showed increased interstitial markings. BMP showed sodium 139, potassium 3.7, CO2 24, serum creatinine 0.34. WBC 20.0, hemoglobin 6.9, platelets 172,000. Hemoccult was positive with brown/dark stool. EKG shows sinus rhythm with nonspecific T wave changes.   Assessment/Plan: Sepsis -present on admission -due to pneumonia vs urine -Follow blood and urine cultures -Check lactic acid 1,4 -Check procalcitonin 0.16>>0.33 -Obtain UA--11-20 WBC -continue empiric cefepime-->D/c -start ceftriaxone -add azithro -continue IVF>>saline lock  Acute blood loss anemia -No prior records to clarify the patient's hemoglobin baseline -GI consult appreciated-->possible endoscopy -Transfused 2 units PRBCs -Continue Protonix drip -Check coags--INR 1.2, PTT 40  Acute respiratory failure with hypoxia -due to pneumonia in setting of underlying COPD -CT chest--diffuse airspace disease RUL; patchy GGO LUL and LLL -on 4 LNC -keep saturation 63-84%  Acute diastolic CHF -primarily cor pulmonale -10/11/19 Echo--EF >75%; severely elevated PASP -start IV furosemide -daily weight -accurate I/Os  Right leg cellulitis -continue abx as above  Diarrhea -check Cdiff  Failure to thrive -Serum Y65 >9935 -Folic TSVX--79.3 -TSH 9.030 -PT eval  Left lower extremity ulceration -Wound care consult appreciated -See pictures on 10/20/2019  Thrombocytosis -Due to combination of acute phase reaction as well as likely iron deficiency -Check iron studies --iron sat 3%, ferritin 47 -replace iron once GI evaluation is finished  Rheumatoid arthritis -Holding methotrexate in the setting of  Sepsis  Hyperthyroidism -TSH 0.071 -Free T4 1.20 -check thyroperoxidase and thyroglobulin ab -outpt endocrine  referral  COPD/tobacco abuse -continue duonebs -continue pulmicort  HTN -holding HCTZ/irbesartan  Hyperlipidemia -restart statin      Status is: Inpatient  Remains inpatient appropriate because:IV treatments appropriate due to intensity of illness or inability to take PO -fluid overloaded on IV lasix; requires IV abx; remains on 4L   Dispo: The patient is from: Home  Anticipated d/c is to: SNF  Anticipated d/c date is: 2 days  Patient currently is not medically stable to d/c.        Family Communication:   Spouse updated at bedside 6/5  Consultants:  GI  Code Status:  FULL   DVT Prophylaxis:  SCDs   Procedures: As Listed in Progress Note Above  Antibiotics: Ceftriaxone 6/5>>> Cefepime 6/3>>>6/5 Azithro 6/4>>> vanc 6/3      Subjective: Pt complains of sob, worse than yesterday.  Denies f/c, cp, n/v/d, abd pain, headache.  Objective: Vitals:   10/12/19 0900 10/12/19 1000 10/12/19 1100 10/12/19 1123  BP: (!) 138/95 116/71 131/84   Pulse: 96 86 82   Resp: 20 15 17    Temp:    98 F (36.7 C)  TempSrc:    Oral  SpO2: 96% 98% 96%   Weight:      Height:        Intake/Output Summary (Last 24 hours) at 10/12/2019 1210 Last data filed at 10/12/2019 0829 Gross per 24 hour  Intake 745.6 ml  Output 400 ml  Net 345.6 ml   Weight change:  Exam:   General:  Pt is alert, follows commands appropriately, not in acute distress  HEENT: No icterus, No thrush, No neck mass, [place]/AT  Cardiovascular: RRR, S1/S2, no rubs, no gallops  Respiratory: bilateral scattered rhonchi  Abdomen: Soft/+BS, non tender, non distended, no guarding  Extremities: 2+LE edema, No lymphangitis, No petechiae, No rashes, no synovitis   Data Reviewed: I have personally reviewed following labs and imaging studies Basic Metabolic Panel: Recent Labs  Lab 10/26/2019 1042 10/11/19 0441 10/12/19 0448  NA 139 143 145  K  3.7 3.8 4.1  CL 104 114* 117*  CO2 24 21* 19*  GLUCOSE 87 68* 104*  BUN 27* 26* 33*  CREATININE 0.34* 0.32* 0.39*  CALCIUM 8.1* 7.3* 7.4*  MG  --   --  1.7   Liver Function Tests: Recent Labs  Lab 10/30/2019 1042 10/11/19 0441  AST 34 31  ALT 32 28  ALKPHOS 92 84  BILITOT 0.7 0.9  PROT 5.3* 4.7*  ALBUMIN 2.4* 2.0*   No results for input(s): LIPASE, AMYLASE in the last 168 hours. No results for input(s): AMMONIA in the last 168 hours. Coagulation Profile: Recent Labs  Lab 10/24/2019 1343  INR 1.2   CBC: Recent Labs  Lab 10/29/2019 1042 10/11/19 0441 10/12/19 0448  WBC 20.2* 18.3* 23.2*  NEUTROABS 18.9*  --   --   HGB 6.9* 9.0* 10.1*  HCT 20.5* 26.9* 31.0*  MCV 94.5 92.1 93.1  PLT 472* 381 494*   Cardiac Enzymes: No results for input(s): CKTOTAL, CKMB, CKMBINDEX, TROPONINI in the last 168 hours. BNP: Invalid input(s): POCBNP CBG: No results for input(s): GLUCAP in the last 168 hours. HbA1C: No results for input(s): HGBA1C in the last 72 hours. Urine analysis:    Component Value Date/Time   COLORURINE AMBER (A) 10/09/2019 1336   APPEARANCEUR HAZY (A) 10/24/2019 1336   LABSPEC 1.023 10/14/2019 1336   PHURINE 5.0 10/20/2019 1336   GLUCOSEU NEGATIVE 10/23/2019 1336   HGBUR NEGATIVE 10/20/2019 1336   BILIRUBINUR NEGATIVE 10/30/2019 1336   KETONESUR NEGATIVE 10/21/2019  [place] 10/14/2019 1336   NITRITE POSITIVE (A) 10/09/2019 1336   LEUKOCYTESUR NEGATIVE 10/14/2019 1336   Sepsis Labs: @LABRCNTIP (procalcitonin:4,lacticidven:4) ) Recent Results (from the past 240 hour(s))  Culture, blood (single) w Reflex to ID Panel     Status: None (Preliminary result)   Collection Time: 10/31/2019 10:59 AM   Specimen: BLOOD RIGHT FOREARM  Result Value Ref Range Status   Specimen Description BLOOD RIGHT FOREARM  Final   Special Requests   Final    BOTTLES DRAWN AEROBIC AND ANAEROBIC Blood Culture adequate volume   Culture   Final    NO GROWTH 2  DAYS Performed at [facility], [address] [place], [place] [address]    Report Status PENDING  Incomplete  SARS Coronavirus 2 by RT PCR (hospital order, performed in [facility] hospital lab) Nasopharyngeal Nasopharyngeal Swab     Status: None   Collection Time: 10/14/2019  1:35 PM   Specimen: Nasopharyngeal Swab  Result Value Ref Range Status   SARS Coronavirus 2 NEGATIVE NEGATIVE Final    Comment: (NOTE) SARS-CoV-2 target nucleic acids are NOT DETECTED. The SARS-CoV-2 RNA is generally detectable in upper and lower respiratory specimens during the acute phase of infection. The lowest concentration of SARS-CoV-2 viral copies this assay can detect is 250 copies / mL. A negative result does not preclude SARS-CoV-2 infection and should not be used as the sole basis for treatment or other patient management decisions.  A negative result may occur with improper specimen collection / handling, submission of specimen other than nasopharyngeal swab, presence of viral mutation(s) within the areas targeted by this assay, and inadequate number of viral copies (<250 copies / mL). A negative result must be combined with clinical observations, patient history, and epidemiological information. Fact Sheet for Patients:   StrictlyIdeas.no Fact Sheet for Healthcare Providers: BankingDealers.co.za This test is not yet approved or cleared  by the [place] FDA and has been authorized for detection and/or diagnosis of SARS-CoV-2 by FDA under an Emergency Use Authorization (EUA).  This EUA will remain in effect (meaning this test can be used) for the duration of the COVID-19 declaration under Section 564(b)(1) of the Act, 21 U.S.C. section 360bbb-3(b)(1), unless the authorization is terminated or revoked sooner. Performed at [facility], [address], [place], [place] [address]   Culture, Urine     Status: Abnormal   Collection Time: 10/19/2019  1:36  PM   Specimen: Urine, Clean Catch  Result Value Ref Range Status   Specimen Description   Final    URINE, CLEAN CATCH Performed at [facility], [address], [place], [place] [address]    Special Requests   Final    NONE Performed at [facility], [address], [place], [place] [address]    Culture >=100,000 COLONIES/mL KLEBSIELLA PNEUMONIAE (A)  Final   Report Status 10/12/2019 FINAL  Final   Organism ID, Bacteria KLEBSIELLA PNEUMONIAE (A)  Final      Susceptibility   Klebsiella pneumoniae - MIC*    AMPICILLIN RESISTANT Resistant     CEFAZOLIN <=4 SENSITIVE Sensitive     CEFTRIAXONE <=1 SENSITIVE Sensitive     CIPROFLOXACIN <=0.25 SENSITIVE Sensitive     GENTAMICIN <=1 SENSITIVE Sensitive     IMIPENEM <=0.25 SENSITIVE Sensitive     NITROFURANTOIN <=16 SENSITIVE Sensitive     TRIMETH/SULFA <=20 SENSITIVE Sensitive     AMPICILLIN/SULBACTAM <=2 SENSITIVE Sensitive     PIP/TAZO <=4 SENSITIVE Sensitive     * >=  100,000 COLONIES/mL KLEBSIELLA PNEUMONIAE  MRSA PCR Screening     Status: None   Collection Time: 10/20/2019  1:45 PM   Specimen: Nasal Mucosa; Nasopharyngeal  Result Value Ref Range Status   MRSA by PCR NEGATIVE NEGATIVE Final    Comment:        The GeneXpert MRSA Assay (FDA approved for NASAL specimens only), is one component of a comprehensive MRSA colonization surveillance program. It is not intended to diagnose MRSA infection nor to guide or monitor treatment for MRSA infections. Performed at [facility], [address] [place], [place] [address]      Scheduled Meds: . azithromycin  500 mg Oral Daily  . budesonide (PULMICORT) nebulizer solution  0.5 mg Nebulization BID  . Chlorhexidine Gluconate Cloth  6 each Topical Daily  . feeding supplement (ENSURE ENLIVE)  237 mL Oral BID BM  . furosemide  40 mg Intravenous Daily  . ipratropium-albuterol  3 mL Nebulization Q6H  . [START ON 10/14/2019] pantoprazole  40 mg Intravenous Q12H   Continuous Infusions: .  sodium chloride    . cefTRIAXone (ROCEPHIN)  IV    . pantoprozole (PROTONIX) infusion 8 mg/hr (10/11/19 2350)    Procedures/Studies: DG Chest 2 View  Result Date: 10/16/2019 CLINICAL DATA:  Weakness. EXAM: CHEST - 2 VIEW COMPARISON:  None. FINDINGS: The heart size and mediastinal contours are within normal limits. Atherosclerosis of thoracic aorta is noted. No pneumothorax is noted. Central pulmonary vascular congestion is noted with possible bilateral pulmonary edema. Small pleural effusions are noted. The visualized skeletal structures are unremarkable. IMPRESSION: Central pulmonary vascular congestion may be present with possible bilateral pulmonary edema and small pleural effusions. Electronically Signed   By: Marijo Conception M.D.   On: 10/18/2019 11:50   CT CHEST WO CONTRAST  Result Date: 10/13/2019 CLINICAL DATA:  Shortness of breath EXAM: CT CHEST WITHOUT CONTRAST TECHNIQUE: Multidetector CT imaging of the chest was performed following the standard protocol without IV contrast. COMPARISON:  Chest x-ray today FINDINGS: Cardiovascular: Heart is normal size. Coronary artery and aortic atherosclerosis. No evidence of aortic aneurysm. Mediastinum/Nodes: No visible adenopathy. Trachea and esophagus are unremarkable. Thyroid unremarkable. Lungs/Pleura: Small bilateral pleural effusions. Diffuse airspace disease throughout much of the right upper lobe. Patchy ground-glass airspace opacities throughout the left upper lobe with more confluent opacity in the left lower lobe. Findings concerning for pneumonia. Underlying emphysema. Upper Abdomen: Imaging into the upper abdomen shows no acute findings. Musculoskeletal: Chest wall soft tissues are unremarkable. No acute bony abnormality. IMPRESSION: Multifocal airspace disease, most confluent in the right upper lobe and left lower lobe most compatible with multifocal pneumonia. Small bilateral pleural effusions. Coronary artery disease. Aortic Atherosclerosis  (ICD10-I70.0) and Emphysema (ICD10-J43.9). Electronically Signed   By: Rolm Baptise M.D.   On: 10/26/2019 20:38   US Venous Img Lower Bilateral (DVT)  Result Date: 10/11/2019 CLINICAL DATA:  Shortness of breath, lower extremity edema, pain EXAM: BILATERAL LOWER EXTREMITY VENOUS DOPPLER ULTRASOUND TECHNIQUE: Gray-scale sonography with graded compression, as well as color Doppler and duplex ultrasound were performed to evaluate the lower extremity deep venous systems from the level of the common femoral vein and including the common femoral, femoral, profunda femoral, popliteal and calf veins including the posterior tibial, peroneal and gastrocnemius veins when visible. The superficial great saphenous vein was also interrogated. Spectral Doppler was utilized to evaluate flow at rest and with distal augmentation maneuvers in the common femoral, femoral and popliteal veins. COMPARISON:  None. FINDINGS: RIGHT LOWER EXTREMITY Common  Femoral Vein: No evidence of thrombus. Normal compressibility, respiratory phasicity and response to augmentation. Saphenofemoral Junction: No evidence of thrombus. Normal compressibility and flow on color Doppler imaging. Profunda Femoral Vein: No evidence of thrombus. Normal compressibility and flow on color Doppler imaging. Femoral Vein: No evidence of thrombus. Normal compressibility, respiratory phasicity and response to augmentation. Popliteal Vein: No evidence of thrombus. Normal compressibility, respiratory phasicity and response to augmentation. Calf Veins: No evidence of thrombus. Normal compressibility and flow on color Doppler imaging. LEFT LOWER EXTREMITY Common Femoral Vein: No evidence of thrombus. Normal compressibility, respiratory phasicity and response to augmentation. Saphenofemoral Junction: No evidence of thrombus. Normal compressibility and flow on color Doppler imaging. Profunda Femoral Vein: No evidence of thrombus. Normal compressibility and flow on color Doppler  imaging. Femoral Vein: No evidence of thrombus. Normal compressibility, respiratory phasicity and response to augmentation. Popliteal Vein: No evidence of thrombus. Normal compressibility, respiratory phasicity and response to augmentation. Calf Veins: No evidence of thrombus. Normal compressibility and flow on color Doppler imaging. Other Findings: Left popliteal fossa thick-walled cystic structure without vascular flow measures 5.1 x 1.2 x 3.0 cm compatible with a Baker's cyst. IMPRESSION: No evidence of deep venous thrombosis in either lower extremity. Electronically Signed   By: Jerilynn Mages.  Shick M.D.   On: 10/11/2019 13:23   ECHOCARDIOGRAM COMPLETE  Result Date: 10/11/2019    ECHOCARDIOGRAM REPORT   Patient Name:   AKIERA ALLBAUGH Date of Exam: 10/11/2019 Medical Rec #:  [id number]       Height:       59.0 in Accession #:    [id number]      Weight:       96.3 lb Date of Birth:  May 24, 1950        BSA:          1.352 m Patient Age:    61 years        BP:           122/65 mmHg Patient Gender: F               HR:           52 bpm. Exam Location:  [facility] Procedure: 2D Echo Indications:    Dyspnea 786.09 / R06.00  History:        Patient has no prior history of Echocardiogram examinations.                 Risk Factors:Current Smoker. Lobar pneumonia , SIRS (systemic                 inflammatory response syndrome, Anemia.  Sonographer:    Leavy Cella RDCS (AE) Referring Phys: [id number] Dhani Dannemiller IMPRESSIONS  1. Left ventricular ejection fraction, by estimation, is >75%. The left ventricle has hyperdynamic function. The left ventricle has no regional wall motion abnormalities. Left ventricular diastolic parameters were normal.  2. Right ventricular systolic function is normal. The right ventricular size is normal. There is severely elevated pulmonary artery systolic pressure.  3. Left atrial size was severely dilated.  4. Right atrial size was mild to moderately dilated.  5. The mitral valve is normal in structure. Trivial  mitral valve regurgitation.  6. The aortic valve is tricuspid. Aortic valve regurgitation is not visualized. Mild to moderate aortic valve sclerosis/calcification is present, without any evidence of aortic stenosis.  7. The inferior vena cava is dilated in size with >50% respiratory variability, suggesting right atrial pressure of 8 mmHg. FINDINGS  Left Ventricle: Left ventricular  ejection fraction, by estimation, is >75%. The left ventricle has hyperdynamic function. The left ventricle has no regional wall motion abnormalities. The left ventricular internal cavity size was normal in size. There is no left ventricular hypertrophy. Left ventricular diastolic parameters were normal. Right Ventricle: The right ventricular size is normal. No increase in right ventricular wall thickness. Right ventricular systolic function is normal. There is severely elevated pulmonary artery systolic pressure. The tricuspid regurgitant velocity is 3.66 m/s, and with an assumed right atrial pressure of 10 mmHg, the estimated right ventricular systolic pressure is 08.6 mmHg. Left Atrium: Left atrial size was severely dilated. Right Atrium: Right atrial size was mild to moderately dilated. Pericardium: There is no evidence of pericardial effusion. Mitral Valve: The mitral valve is normal in structure. Trivial mitral valve regurgitation. Tricuspid Valve: The tricuspid valve is normal in structure. Tricuspid valve regurgitation is mild. Aortic Valve: The aortic valve is tricuspid. Aortic valve regurgitation is not visualized. Mild to moderate aortic valve sclerosis/calcification is present, without any evidence of aortic stenosis. Pulmonic Valve: The pulmonic valve was not well visualized. Pulmonic valve regurgitation is not visualized. Aorta: The aortic root is normal in size and structure. Venous: The inferior vena cava is dilated in size with greater than 50% respiratory variability, suggesting right atrial pressure of 8 mmHg.  IAS/Shunts: No atrial level shunt detected by color flow Doppler.  LEFT VENTRICLE PLAX 2D LVIDd:         3.81 cm  Diastology LVIDs:         1.85 cm  LV e' lateral:   10.80 cm/s LV PW:         0.96 cm  LV E/e' lateral: 7.4 LV IVS:        0.96 cm  LV e' medial:    8.49 cm/s LVOT diam:     1.80 cm  LV E/e' medial:  9.4 LVOT Area:     2.54 cm  RIGHT VENTRICLE RV S prime:     14.90 cm/s TAPSE (M-mode): 2.3 cm LEFT ATRIUM             Index       RIGHT ATRIUM           Index LA diam:        4.40 cm 3.25 cm/m  RA Area:     18.50 cm LA Vol (A2C):   98.8 ml 73.07 ml/m RA Volume:   55.90 ml  41.34 ml/m LA Vol (A4C):   74.1 ml 54.80 ml/m LA Biplane Vol: 89.8 ml 66.41 ml/m   AORTA Ao Root diam: 2.70 cm MITRAL VALVE               TRICUSPID VALVE MV Area (PHT): 3.53 cm    TR Peak grad:   53.6 mmHg MV Decel Time: 215 msec    TR Vmax:        366.00 cm/s MV E velocity: 80.00 cm/s MV A velocity: 42.20 cm/s  SHUNTS MV E/A ratio:  1.90        Systemic Diam: 1.80 cm Dorris Carnes MD Electronically signed by Dorris Carnes MD Signature Date/Time: 10/11/2019/3:34:18 PM    Final     Orson Eva, DO  [facility]  If 7PM-7AM, please contact night-coverage [email or web address] Password [email or web address] 10/12/2019, 12:10 PM   LOS: 2 days

## 2019-10-12 NOTE — Plan of Care (Signed)
  Problem: Acute Rehab PT Goals(only PT should resolve) Goal: Pt will Roll Supine to Side Flowsheets (Taken 10/12/2019 1238) Pt will Roll Supine to Side: with modified independence Goal: Pt Will Go Sit To Supine/Side Flowsheets (Taken 10/12/2019 1238) Pt will go Sit to Supine/Side: with supervision Goal: Patient Will Transfer Sit To/From Stand Flowsheets (Taken 10/12/2019 1238) Patient will transfer sit to/from stand: with min guard assist Goal: Pt Will Transfer Bed To Chair/Chair To Bed Flowsheets (Taken 10/12/2019 1238) Pt will Transfer Bed to Chair/Chair to Bed: min guard assist Note: With RW Goal: Pt Will Ambulate Flowsheets (Taken 10/12/2019 1238) Pt will Ambulate:  10 feet  with min guard assist  with rolling walker   12:39 PM, 10/12/19 Jerene Pitch, DPT Physical Therapy with [facility]  [phone] office

## 2019-10-12 NOTE — Progress Notes (Addendum)
Patient has labored breathing , Rhonchi through out all lung fields. Becomes choked when taking in water. Fluid I/O is about 7000 ahead. Placed on 8 liter hfnc

## 2019-10-12 NOTE — Evaluation (Signed)
Physical Therapy Evaluation Patient Details Name: Sheri Espinoza MRN: [id number] DOB: 1951/03/07 Today's Date: 10/12/2019   History of Present Illness  Sheri Espinoza is a 69 y.o. female with medical history of anxiety/depression, rheumatoid arthritis, diverticulosis, hypertension, COPD, depression presenting with failure to thrive symptoms for the past 2 weeks.  History is supplemented from the patient's spouse at the bedside.  He states that the patient has had fatigue, decline in functional status, decreased oral intake for the better part of the month.  She has lost 15 pounds in the past month.  He states that the patient previously was able to perform all her ADLs without any assistive devices.  However in the past 2 weeks, she has had a significant functional decline resulting in requiring assistance with her activities of daily living and making transfers.  The patient has been having loose stools that has been melanotic.  Apparently, the patient has had a history of loose stools in the past.  It is unclear the duration of her melanotic stools.  There is no hematochezia.  There is no history of fevers, chills, chest pain, shortness breath, coughing, hemoptysis, abdominal pain, dysuria, hematuria.  However, the patient has had decreased oral intake and unsteady gait and generalized weakness.  There is no headaches, visual disturbance, syncope.  She has not been placed on any antibiotics recently.  She takes methotrexate every Friday for her rheumatoid arthritis.  She continues to smoke 1 pack/day with a 40-pack-year history.  According to the patient's spouse, patient has not been on any new medications although she does take Imodium on a as needed basis.The patient's family called EMS on the evening of 6-21.  However, the patient refused to go to the emergency department.  She went to see her PCP on the morning of 10/19/2019.   Clinical Impression   Patient limited for functional mobility as stated  below secondary to BLE weakness, fatigue and poor standing balance. Patient unable to ambulate more then a few steps at bedside to chair with mod assist and lots of encouragment. Patient very fatigued after bed mobilities and transfer to chair. Patient will benefit from continued physical therapy in hospital and recommended venue below to increase strength, balance, endurance for safe ADLs and gait.   Follow Up Recommendations SNF;Supervision for mobility/OOB    Equipment Recommendations       Recommendations for Other Services       Precautions / Restrictions        Mobility  Bed Mobility Overal bed mobility: Needs Assistance Bed Mobility: Rolling;Sidelying to Sit Rolling: Min assist(one hand assist and verbal cues) Sidelying to sit: Min assist(one hand assist and verbal cues)          Transfers Overall transfer level: Needs assistance Equipment used: Rolling walker (2 wheeled) Transfers: Sit to/from [facility] Sit to Stand: Min assist(verbal cues to grab onto walker) Stand pivot transfers: Min assist(verbal cues to move walker)       General transfer comment: slow hesitant movements  Ambulation/Gait Ambulation/Gait assistance: Mod assist(verbal cues to move walker) Gait Distance (Feet): 4 Feet Assistive device: Rolling walker (2 wheeled) Gait Pattern/deviations: Decreased stride length;Step-to pattern;Trunk flexed Gait velocity: decreased speed, labored movements noted, lots of encouragement needed      Stairs            Wheelchair Mobility    Modified Rankin (Stroke Patients Only)       Balance Overall balance assessment: Mild deficits observed, not formally tested  Pertinent Vitals/Pain Pain Assessment: No/denies pain    Home Living Family/patient expects to be discharged to:: Private residence Living Arrangements: Spouse/significant other;Children Available Help at  Discharge: Family;Available PRN/intermittently Type of Home: House Home Access: Stairs to enter Entrance Stairs-Rails: None Entrance Stairs-Number of Steps: 1 Home Layout: Laundry or work area in basement;One level;Able to live on main level with bedroom/bathroom Home Equipment: Gilford Rile - 2 wheels;Bedside commode      Prior Function Level of Independence: Independent with assistive device(s)         Comments: was using RW PRN but driving and taking care of herself prior to hospital admittance     Hand Dominance        Extremity/Trunk Assessment        Lower Extremity Assessment Lower Extremity Assessment: Generalized weakness       Communication   Communication: No difficulties  Cognition Arousal/Alertness: Lethargic Behavior During Therapy: Anxious Overall Cognitive Status: Within Functional Limits for tasks assessed                                 General Comments: patient hesitant to stand and move but very motivated      General Comments      Exercises General Exercises - Lower Extremity Ankle Circles/Pumps: AROM;Seated;Both;10 reps;Strengthening Long Arc Quad: AROM;Seated;Strengthening;Both;10 reps Toe Raises: AROM;Seated;Strengthening;Both;10 reps Heel Raises: AROM;Seated;Strengthening;Both;10 reps   Assessment/Plan    PT Assessment Patient needs continued PT services  PT Problem List Decreased strength;Decreased activity tolerance;Decreased balance;Decreased mobility       PT Treatment Interventions DME instruction;Gait training;Therapeutic activities;Therapeutic exercise;Patient/family education;Balance training    PT Goals (Current goals can be found in the Care Plan section)  Acute Rehab PT Goals Patient Stated Goal: to go home PT Goal Formulation: With patient/family Time For Goal Achievement: 10/26/19 Potential to Achieve Goals: Fair    Frequency Min 3X/week   Barriers to discharge Inaccessible home environment;Decreased  caregiver support family unable to provide min/mod assist with walking and transfers required at home for patient    Co-evaluation               AM-PAC PT "6 Clicks" Mobility  Outcome Measure Help needed turning from your back to your side while in a flat bed without using bedrails?: A Little Help needed moving from lying on your back to sitting on the side of a flat bed without using bedrails?: A Little   Help needed standing up from a chair using your arms (e.g., wheelchair or bedside chair)?: A Lot Help needed to walk in hospital room?: A Lot Help needed climbing 3-5 steps with a railing? : Total 6 Click Score: 11    End of Session Equipment Utilized During Treatment: Gait belt Activity Tolerance: Patient limited by fatigue;Patient limited by lethargy Patient left: in chair;with call bell/phone within reach;with family/visitor present Nurse Communication: Mobility status;Precautions PT Visit Diagnosis: Unsteadiness on feet (R26.81);Difficulty in walking, not elsewhere classified (R26.2);Repeated falls (R29.6);Muscle weakness (generalized) (M62.81)    Time: 7628-3151 PT Time Calculation (min) (ACUTE ONLY): 1415 min   Charges:   PT Evaluation $PT Eval Moderate Complexity: 1 Mod PT Treatments $Therapeutic Exercise: 8-22 mins       12:34 PM, 10/12/19 Jerene Pitch, DPT Physical Therapy with [facility]  [phone] office

## 2019-10-13 ENCOUNTER — Inpatient Hospital Stay (HOSPITAL_COMMUNITY): Payer: Medicare Other

## 2019-10-13 DIAGNOSIS — R0603 Acute respiratory distress: Secondary | ICD-10-CM

## 2019-10-13 DIAGNOSIS — D62 Acute posthemorrhagic anemia: Secondary | ICD-10-CM

## 2019-10-13 DIAGNOSIS — K922 Gastrointestinal hemorrhage, unspecified: Secondary | ICD-10-CM

## 2019-10-13 DIAGNOSIS — Z7189 Other specified counseling: Secondary | ICD-10-CM

## 2019-10-13 DIAGNOSIS — J9601 Acute respiratory failure with hypoxia: Secondary | ICD-10-CM

## 2019-10-13 DIAGNOSIS — J181 Lobar pneumonia, unspecified organism: Secondary | ICD-10-CM

## 2019-10-13 DIAGNOSIS — R627 Adult failure to thrive: Secondary | ICD-10-CM

## 2019-10-13 DIAGNOSIS — A419 Sepsis, unspecified organism: Secondary | ICD-10-CM

## 2019-10-13 LAB — BASIC METABOLIC PANEL
Anion gap: 10 (ref 5–15)
BUN: 44 mg/dL — ABNORMAL HIGH (ref 8–23)
CO2: 18 mmol/L — ABNORMAL LOW (ref 22–32)
Calcium: 7.3 mg/dL — ABNORMAL LOW (ref 8.9–10.3)
Chloride: 117 mmol/L — ABNORMAL HIGH (ref 98–111)
Creatinine, Ser: 0.65 mg/dL (ref 0.44–1.00)
GFR calc Af Amer: >60 mL/min (ref >60)
GFR calc non Af Amer: >60 mL/min (ref >60)
Glucose, Bld: 135 mg/dL — ABNORMAL HIGH (ref 70–99)
Potassium: 4.1 mmol/L (ref 3.5–5.1)
Sodium: 145 mmol/L (ref 135–145)

## 2019-10-13 LAB — CBC
HCT: 32.8 % — ABNORMAL LOW (ref 36.0–46.0)
Hemoglobin: 10.6 g/dL — ABNORMAL LOW (ref 12.0–15.0)
MCH: 29.9 pg (ref 26.0–34.0)
MCHC: 32.3 g/dL (ref 30.0–36.0)
MCV: 92.4 fL (ref 80.0–100.0)
Platelets: 503 10*3/uL — ABNORMAL HIGH (ref 150–400)
RBC: 3.55 MIL/uL — ABNORMAL LOW (ref 3.87–5.11)
RDW: 17.4 % — ABNORMAL HIGH (ref 11.5–15.5)
WBC: 24.4 10*3/uL — ABNORMAL HIGH (ref 4.0–10.5)
nRBC: 0 % (ref 0.0–0.2)

## 2019-10-13 LAB — BLOOD GAS, ARTERIAL
Acid-base deficit: 5.5 mmol/L — ABNORMAL HIGH (ref 0.0–2.0)
Bicarbonate: 19.5 mmol/L — ABNORMAL LOW (ref 20.0–28.0)
FIO2: 50
O2 Saturation: 85.9 %
Patient temperature: 37
pCO2 arterial: 42.4 mmHg (ref 32.0–48.0)
pH, Arterial: 7.293 — ABNORMAL LOW (ref 7.350–7.450)
pO2, Arterial: 58.3 mmHg — ABNORMAL LOW (ref 83.0–108.0)

## 2019-10-13 LAB — MAGNESIUM: Magnesium: 2.2 mg/dL (ref 1.7–2.4)

## 2019-10-13 LAB — PROCALCITONIN: Procalcitonin: 1.63 ng/mL

## 2019-10-13 MED ORDER — METHYLPREDNISOLONE SODIUM SUCC 125 MG IJ SOLR
125.0000 mg | Freq: Once | INTRAMUSCULAR | Status: AC
  Administered 2019-10-13: 125 mg via INTRAVENOUS
  Filled 2019-10-13: qty 2

## 2019-10-13 MED ORDER — METHYLPREDNISOLONE SODIUM SUCC 125 MG IJ SOLR
60.0000 mg | Freq: Three times a day (TID) | INTRAMUSCULAR | Status: DC
  Administered 2019-10-13: 60 mg via INTRAVENOUS
  Filled 2019-10-13: qty 2

## 2019-10-13 MED ORDER — PIPERACILLIN-TAZOBACTAM 3.375 G IVPB
3.3750 g | Freq: Three times a day (TID) | INTRAVENOUS | Status: DC
  Administered 2019-10-13 – 2019-10-14 (×3): 3.375 g via INTRAVENOUS
  Filled 2019-10-13 (×3): qty 50

## 2019-10-13 MED ORDER — SODIUM CHLORIDE 0.9 % IV SOLN
INTRAVENOUS | Status: DC | PRN
  Administered 2019-10-13: 250 mL via INTRAVENOUS

## 2019-10-13 MED ORDER — MORPHINE SULFATE (PF) 2 MG/ML IV SOLN
2.0000 mg | INTRAVENOUS | Status: DC | PRN
  Administered 2019-10-13 – 2019-10-14 (×8): 2 mg via INTRAVENOUS
  Filled 2019-10-13 (×8): qty 1

## 2019-10-13 MED ORDER — MORPHINE SULFATE (PF) 2 MG/ML IV SOLN: 1.0000 mg | INTRAVENOUS | Status: DC | PRN

## 2019-10-13 NOTE — Progress Notes (Signed)
  Subjective: Pt complains sob.  No pain. Remains on bipap  Vitals:   10/13/19 1223 10/13/19 1225 10/13/19 1230 10/13/19 1300  BP:   114/73 110/78  Pulse: (!) 103 100 (!) 102 (!) 118  Resp: (!) 24 (!) 30 (!) 27 (!) 28  Temp:      TempSrc:      SpO2: 99% 100% 99% 100%  Weight:      Height:       CV--RRR Lung--bilateral rhonchi Abd--soft+BS/NT   Assessment/Plan: Acute respiratory failure -multifactorial--COPD exacerbation, aspiration pneumonia, CHF -pt oxygenating well on BiPAP but remains tachypneic -updated spouse at bedside -had another goals of care discussion -Again expressed my concern regarding very guarded/poor prognosis in setting of patient multiple medical problems, frailty, poor pulm reserve and current critical illness -spouse stated he is going to speak with daughter and possible transition pt to DNR, comfort care -personally reviewed CXR--increase bilateral opacities -broaden abx to zosyn     Orson Eva, DO [facility]

## 2019-10-13 NOTE — Progress Notes (Signed)
According to patient's spouse, patient has been worse today than she has the last two days, and definitely worse than when attending RN had the patient on Thursday. Pt has been on the bi-pap since 0730 this morning. Patient was attempting to get out of the bed, reaching towards the sky, and attempting to tell the spouse and nurse something but unable to understand what patient wanted. Attending RN performed a neuro screening to make sure a Code Stroke didn't need to be called, patient was able to perform most tasks appropriately. Pt husband very anxious, MD called and made aware of the situation. MD came and assessed the patient, and spoke with the husband about goals of care. Pt sister also wanted to come and visit, MD stated that was fine, so sister, daughter and spouse are allowed at bedside. Will continue to monitor.

## 2019-10-13 NOTE — Progress Notes (Signed)
Patient is tolerating BiPAP extremely well. At beginning of shift she was having a hard time keep her saturation even on 8 liter hfnc. Accessory muscles were being used . Since placed on BiPAP she has been able to lie flat. FiO2 down to 35. Saturation 97.

## 2019-10-13 NOTE — Progress Notes (Addendum)
PROGRESS NOTE  Sheri Espinoza NLG:[id number] DOB: 10/25/1950 DOA: 10/25/2019 PCP: Josem Kaufmann, MD  Brief History: y.o.femalewith medical history ofanxiety/depression, rheumatoid arthritis, diverticulosis, hypertension, COPD, depression presenting with failure to thrive symptoms for the past 2 weeks. History is supplemented from the patient's spouse at the bedside. He states that the patient has had fatigue, decline in functional status, decreased oral intake for the better part of the month. She has lost 15 pounds in the past month. He states that the patient previously was able to perform all her ADLs without any assistive devices. However in the past 2 weeks, she has had a significant functional decline resulting in requiring assistance with her activities of daily living and making transfers. The patient has been having loose stools that has been melanotic. Apparently, the patient has had a history of loose stools in the past. It is unclear the duration of her melanotic stools. There is no hematochezia. There is no history of fevers, chills, chest pain, shortness breath, coughing, hemoptysis, abdominal pain, dysuria, hematuria. However, the patient has had decreased oral intake and unsteady gait and generalized weakness. There is no headaches, visual disturbance, syncope. She has not been placed on any antibiotics recently. She takes methotrexate every Friday for her rheumatoid arthritis. She continues to smoke 1 pack/day with a 40-pack-year history. According to the patient's spouse, patient has not been on any new medications although she does take Imodium on a as needed basis. The patient's family called EMS on the evening of 6-21. However, the patient refused to go to the emergency department. She went to see her PCP on the morning of 11/01/2019. Routine blood work was performed and showed WBC 20,000 and hemoglobin 7. The patient was contacted and directed to go to the  emergency department. In the emergency department, the patient was afebrile with soft blood pressures. Systolic blood pressures were in the mid 90s. Oxygen saturation was 98% on 4 L. Chest x-ray showed increased interstitial markings. BMP showed sodium 139, potassium 3.7, CO2 24, serum creatinine 0.34. WBC 20.0, hemoglobin 6.9, platelets 172,000. Hemoccult was positive with brown/dark stool. EKG shows sinus rhythm with nonspecific T wave changes.  On the evening 10/12/19, patient's condition declined.  She became more sob and required BiPAP.   Assessment/Plan: Sepsis -present on admission -due to pneumonia vs urine -Follow bloodand urinecultures -Check lactic acid1,4 -Check procalcitonin0.16>>0.33 -Obtain UA--11-20 WBC -continueempiric cefepime-->D/c -start ceftriaxone -add azithro -continue IVF>>saline lock -WBC slowly increasing -repeat blood cultures  Acute blood loss anemia -No prior records to clarify the patient's hemoglobin baseline -GI consultappreciated-->possible endoscopy -Transfused 2 unitsPRBCs-->hgb remains stable -Continue Protonix drip -Check coags--INR 1.2, PTT 40  Acute respiratory failure with hypoxia -due topneumonia in setting ofunderlying COPD -CT chest--diffuse airspace disease RUL; patchy GGO LUL and LLL -respiratory status worsen overnight-->BiPAP -wean for saturation 90-92% -repeat CXR -check ABG -follow sputum culture  Acute diastolic CHF -primarily cor pulmonale -10/11/19 Echo--EF >75%; severely elevated PASP -st IV furosemide -daily weight -accurate I/Os  COPD exacerbation -start IV solumedrol -start duoneb -start pulmicort  Right leg cellulitis -continue abx as above  Diarrhea -check Cdiff--neg  Failure to thrive -Serum Y81>4481 -Folic EHUD--14.9 -FWY6.378 -PT eval->SNF  Left lower extremity ulceration -Wound care consultappreciated -See pictureson 11/04/2019 -venous duplex--neg for  DVT  Thrombocytosis -Due to combination of acute phase reaction as well as likely iron deficiency -Check iron studies--iron sat 3%, ferritin 47 -replace iron once GI evaluation is finished  Rheumatoid  arthritis -Holding methotrexate in the setting of Sepsis  Hyperthyroidism -TSH 0.071 -Free T4 1.20 -check thyroperoxidase and thyroglobulin ab -outpt endocrine referral  COPD/tobacco abuse -continue duonebs -continue pulmicort  HTN -holding HCTZ/irbesartan  Hyperlipidemia -restart statin  Goals of Care -discussed with spouse 6/6 -Advance care planning, including the explanation and discussion of advance directives was carried out with the patient and family.  Code status including explanations of "Full Code" and "DNR" and alternatives were discussed in detail.  Discussion of end-of-life issues including but not limited palliative care, hospice care and the concept of hospice, other end-of-life care options, power of attorney for health care decisions, living wills, and physician orders for life-sustaining treatment were also discussed with the patient and family.  Total face to face time 16 minutes. -spouse wishes for FULL CODE -discussed pt's clinical decline and overall guarded/poor prognosis in setting of patient's frailty and multiple comorbidities       Status is: Inpatient  Remains inpatient appropriate because:IV treatments appropriate due to intensity of illness or inability to take PO -fluid overloaded on IV lasix; requires IV abx; remains on BiPAP   Dispo: The patient is from:Home Anticipated d/c is to:SNF Anticipated d/c date is: unclear--critical condition Patient currentlyis not medically stable to d/c.        Family Communication:Spouse updatedat bedside 6/6  Consultants:GI  Code Status: FULL   DVT Prophylaxis: SCDs   Procedures: As Listed in Progress Note  Above  Antibiotics: Ceftriaxone 6/5>>> Cefepime 6/3>>>6/5 Azithro6/4>>> vanc6/3     The patient is critically ill with multiple organ systems failure and requires high complexity decision making for assessment and support, frequent evaluation and titration of therapies, application of advanced monitoring technologies and extensive interpretation of multiple databases.  Critical care time - 40 mins.     Subjective: Pt complains sob.  Remainder ROS limited due to extremis.  No cp, n/v, abd pain.  Objective: Vitals:   10/13/19 0718 10/13/19 0721 10/13/19 0900 10/13/19 0915  BP:  121/84 (!) 95/51 121/84  Pulse:  (!) 101 (!) 111 61  Resp:  (!) 26 (!) 26 15  Temp:  98.6 F (37 C)    TempSrc:  Oral    SpO2: 97% 97% 93% (!) 87%  Weight:      Height:        Intake/Output Summary (Last 24 hours) at 10/13/2019 0959 Last data filed at 10/13/2019 0600 Gross per 24 hour  Intake 2038.15 ml  Output 500 ml  Net 1538.15 ml   Weight change:  Exam:   General:  Pt is alert, follows commands appropriately, not in acute distress  HEENT: No icterus, No thrush, No neck mass, Wing/AT  Cardiovascular: RRR, S1/S2, no rubs, no gallops  Respiratory: bilateral wheeze.  Bilateral rhonchi  Abdomen: Soft/+BS, non tender, non distended, no guarding  Extremities: 1+LE edema, No lymphangitis, No petechiae, No rashes, no synovitis   Data Reviewed: I have personally reviewed following labs and imaging studies Basic Metabolic Panel: Recent Labs  Lab 10/18/2019 1042 10/11/19 0441 10/12/19 0448 10/13/19 0301  NA 139 143 145 145  K 3.7 3.8 4.1 4.1  CL 104 114* 117* 117*  CO2 24 21* 19* 18*  GLUCOSE 87 68* 104* 135*  BUN 27* 26* 33* 44*  CREATININE 0.34* 0.32* 0.39* 0.65  CALCIUM 8.1* 7.3* 7.4* 7.3*  MG  --   --  1.7 2.2   Liver Function Tests: Recent Labs  Lab 10/29/2019 1042 10/11/19 0441  AST 34 31  ALT  32 28  ALKPHOS 92 84  BILITOT 0.7 0.9  PROT 5.3* 4.7*  ALBUMIN 2.4* 2.0*    No results for input(s): LIPASE, AMYLASE in the last 168 hours. No results for input(s): AMMONIA in the last 168 hours. Coagulation Profile: Recent Labs  Lab 10/24/2019 1343  INR 1.2   CBC: Recent Labs  Lab 11/01/2019 1042 10/11/19 0441 10/12/19 0448 10/13/19 0301  WBC 20.2* 18.3* 23.2* 24.4*  NEUTROABS 18.9*  --   --   --   HGB 6.9* 9.0* 10.1* 10.6*  HCT 20.5* 26.9* 31.0* 32.8*  MCV 94.5 92.1 93.1 92.4  PLT 472* 381 494* 503*   Cardiac Enzymes: No results for input(s): CKTOTAL, CKMB, CKMBINDEX, TROPONINI in the last 168 hours. BNP: Invalid input(s): POCBNP CBG: No results for input(s): GLUCAP in the last 168 hours. HbA1C: No results for input(s): HGBA1C in the last 72 hours. Urine analysis:    Component Value Date/Time   COLORURINE AMBER (A) 10/27/2019 1336   APPEARANCEUR HAZY (A) 10/14/2019 1336   LABSPEC 1.023 10/18/2019 1336   PHURINE 5.0 11/05/2019 1336   GLUCOSEU NEGATIVE 10/22/2019 1336   HGBUR NEGATIVE 11/02/2019 1336   BILIRUBINUR NEGATIVE 10/21/2019 1336   KETONESUR NEGATIVE 10/26/2019 1336   PROTEINUR NEGATIVE 10/09/2019 1336   NITRITE POSITIVE (A) 10/13/2019 1336   LEUKOCYTESUR NEGATIVE 11/02/2019 1336   Sepsis Labs: @LABRCNTIP (procalcitonin:4,lacticidven:4) ) Recent Results (from the past 240 hour(s))  Culture, blood (single) w Reflex to ID Panel     Status: None (Preliminary result)   Collection Time: 10/25/2019 10:59 AM   Specimen: BLOOD RIGHT FOREARM  Result Value Ref Range Status   Specimen Description BLOOD RIGHT FOREARM  Final   Special Requests   Final    BOTTLES DRAWN AEROBIC AND ANAEROBIC Blood Culture adequate volume   Culture   Final    NO GROWTH 2 DAYS Performed at [facility], [address] [place], [place] [address]    Report Status PENDING  Incomplete  SARS Coronavirus 2 by RT PCR (hospital order, performed in [place] hospital lab) Nasopharyngeal Nasopharyngeal Swab     Status: None   Collection Time: 11/04/2019  1:35 PM    Specimen: Nasopharyngeal Swab  Result Value Ref Range Status   SARS Coronavirus 2 NEGATIVE NEGATIVE Final    Comment: (NOTE) SARS-CoV-2 target nucleic acids are NOT DETECTED. The SARS-CoV-2 RNA is generally detectable in upper and lower respiratory specimens during the acute phase of infection. The lowest concentration of SARS-CoV-2 viral copies this assay can detect is 250 copies / mL. A negative result does not preclude SARS-CoV-2 infection and should not be used as the sole basis for treatment or other patient management decisions.  A negative result may occur with improper specimen collection / handling, submission of specimen other than nasopharyngeal swab, presence of viral mutation(s) within the areas targeted by this assay, and inadequate number of viral copies (<250 copies / mL). A negative result must be combined with clinical observations, patient history, and epidemiological information. Fact Sheet for Patients:   StrictlyIdeas.no Fact Sheet for Healthcare Providers: BankingDealers.co.za This test is not yet approved or cleared  by the [place] FDA and has been authorized for detection and/or diagnosis of SARS-CoV-2 by FDA under an Emergency Use Authorization (EUA).  This EUA will remain in effect (meaning this test can be used) for the duration of the COVID-19 declaration under Section 564(b)(1) of the Act, 21 U.S.C. section 360bbb-3(b)(1), unless the authorization is terminated or revoked sooner. Performed at  [facility], [address], [place], [place] [address]   Culture, Urine     Status: Abnormal   Collection Time: 11/05/2019  1:36 PM   Specimen: Urine, Clean Catch  Result Value Ref Range Status   Specimen Description   Final    URINE, CLEAN CATCH Performed at [facility], [address], [place], [place] [address]    Special Requests   Final    NONE Performed at [facility], [address] [place],  [place] [address]    Culture >=100,000 COLONIES/mL KLEBSIELLA PNEUMONIAE (A)  Final   Report Status 10/12/2019 FINAL  Final   Organism ID, Bacteria KLEBSIELLA PNEUMONIAE (A)  Final      Susceptibility   Klebsiella pneumoniae - MIC*    AMPICILLIN RESISTANT Resistant     CEFAZOLIN <=4 SENSITIVE Sensitive     CEFTRIAXONE <=1 SENSITIVE Sensitive     CIPROFLOXACIN <=0.25 SENSITIVE Sensitive     GENTAMICIN <=1 SENSITIVE Sensitive     IMIPENEM <=0.25 SENSITIVE Sensitive     NITROFURANTOIN <=16 SENSITIVE Sensitive     TRIMETH/SULFA <=20 SENSITIVE Sensitive     AMPICILLIN/SULBACTAM <=2 SENSITIVE Sensitive     PIP/TAZO <=4 SENSITIVE Sensitive     * >=100,000 COLONIES/mL KLEBSIELLA PNEUMONIAE  MRSA PCR Screening     Status: None   Collection Time: 11/05/2019  1:45 PM   Specimen: Nasal Mucosa; Nasopharyngeal  Result Value Ref Range Status   MRSA by PCR NEGATIVE NEGATIVE Final    Comment:        The GeneXpert MRSA Assay (FDA approved for NASAL specimens only), is one component of a comprehensive MRSA colonization surveillance program. It is not intended to diagnose MRSA infection nor to guide or monitor treatment for MRSA infections. Performed at [facility], [address], [place], [place] [address]   Expectorated sputum assessment w rflx to resp cult     Status: None   Collection Time: 10/12/19  4:30 PM   Specimen: Expectorated Sputum  Result Value Ref Range Status   Specimen Description EXPECTORATED SPUTUM  Final   Special Requests NONE  Final   Sputum evaluation   Final    THIS SPECIMEN IS ACCEPTABLE FOR SPUTUM CULTURE Performed at [facility], [address] [place], [place] [address]    Report Status 10/12/2019 FINAL  Final  Culture, respiratory     Status: None (Preliminary result)   Collection Time: 10/12/19  4:30 PM  Result Value Ref Range Status   Specimen Description   Final    EXPECTORATED SPUTUM Performed at [facility], [address], [place], [place] [address]     Special Requests   Final    NONE Reflexed from [id number] Performed at [facility], [address], [place], [place] [address]    Gram Stain   Final    RARE WBC PRESENT, PREDOMINANTLY PMN RARE YEAST RARE GRAM POSITIVE COCCI Performed at [facility], [place] [address], [place], [place] [address]    Culture PENDING  Incomplete   Report Status PENDING  Incomplete  C Difficile Quick Screen w PCR reflex     Status: None   Collection Time: 10/12/19  7:23 PM   Specimen: STOOL  Result Value Ref Range Status   C Diff antigen NEGATIVE NEGATIVE Final   C Diff toxin NEGATIVE NEGATIVE Final   C Diff interpretation No C. difficile detected.  Final    Comment: Performed at [facility], [address], [place], [place] [address]  Scheduled Meds: . azithromycin  500 mg Oral Daily  . budesonide (PULMICORT) nebulizer solution  0.5 mg Nebulization BID  . chlorhexidine  15 mL Mouth Rinse BID  . Chlorhexidine Gluconate Cloth  6 each Topical Daily  . feeding supplement (ENSURE ENLIVE)  237 mL Oral BID BM  . furosemide  40 mg Intravenous Daily  . ipratropium-albuterol  3 mL Nebulization Q6H  . mouth rinse  15 mL Mouth Rinse q12n4p  . methylPREDNISolone (SOLU-MEDROL) injection  125 mg Intravenous Once  . methylPREDNISolone (SOLU-MEDROL) injection  60 mg Intravenous Q8H  . [START ON 10/14/2019] pantoprazole  40 mg Intravenous Q12H   Continuous Infusions: . sodium chloride    . cefTRIAXone (ROCEPHIN)  IV Stopped (10/12/19 1310)  . pantoprozole (PROTONIX) infusion 8 mg/hr (10/12/19 2356)    Procedures/Studies: DG Chest 2 View  Result Date: 10/17/2019 CLINICAL DATA:  Weakness. EXAM: CHEST - 2 VIEW COMPARISON:  None. FINDINGS: The heart size and mediastinal contours are within normal limits. Atherosclerosis of thoracic aorta is noted. No pneumothorax is noted. Central pulmonary vascular congestion is noted with possible bilateral pulmonary edema. Small pleural effusions are noted. The visualized  skeletal structures are unremarkable. IMPRESSION: Central pulmonary vascular congestion may be present with possible bilateral pulmonary edema and small pleural effusions. Electronically Signed   By: Marijo Conception M.D.   On: 10/09/2019 11:50   CT CHEST WO CONTRAST  Result Date: 10/20/2019 CLINICAL DATA:  Shortness of breath EXAM: CT CHEST WITHOUT CONTRAST TECHNIQUE: Multidetector CT imaging of the chest was performed following the standard protocol without IV contrast. COMPARISON:  Chest x-ray today FINDINGS: Cardiovascular: Heart is normal size. Coronary artery and aortic atherosclerosis. No evidence of aortic aneurysm. Mediastinum/Nodes: No visible adenopathy. Trachea and esophagus are unremarkable. Thyroid unremarkable. Lungs/Pleura: Small bilateral pleural effusions. Diffuse airspace disease throughout much of the right upper lobe. Patchy ground-glass airspace opacities throughout the left upper lobe with more confluent opacity in the left lower lobe. Findings concerning for pneumonia. Underlying emphysema. Upper Abdomen: Imaging into the upper abdomen shows no acute findings. Musculoskeletal: Chest wall soft tissues are unremarkable. No acute bony abnormality. IMPRESSION: Multifocal airspace disease, most confluent in the right upper lobe and left lower lobe most compatible with multifocal pneumonia. Small bilateral pleural effusions. Coronary artery disease. Aortic Atherosclerosis (ICD10-I70.0) and Emphysema (ICD10-J43.9). Electronically Signed   By: Rolm Baptise M.D.   On: 10/29/2019 20:38   US Venous Img Lower Bilateral (DVT)  Result Date: 10/11/2019 CLINICAL DATA:  Shortness of breath, lower extremity edema, pain EXAM: BILATERAL LOWER EXTREMITY VENOUS DOPPLER ULTRASOUND TECHNIQUE: Gray-scale sonography with graded compression, as well as color Doppler and duplex ultrasound were performed to evaluate the lower extremity deep venous systems from the level of the common femoral vein and including the  common femoral, femoral, profunda femoral, popliteal and calf veins including the posterior tibial, peroneal and gastrocnemius veins when visible. The superficial great saphenous vein was also interrogated. Spectral Doppler was utilized to evaluate flow at rest and with distal augmentation maneuvers in the common femoral, femoral and popliteal veins. COMPARISON:  None. FINDINGS: RIGHT LOWER EXTREMITY Common Femoral Vein: No evidence of thrombus. Normal compressibility, respiratory phasicity and response to augmentation. Saphenofemoral Junction: No evidence of thrombus. Normal compressibility and flow on color Doppler imaging. Profunda Femoral Vein: No evidence of thrombus. Normal compressibility and flow on color Doppler imaging. Femoral Vein: No evidence of thrombus. Normal compressibility, respiratory phasicity and response to augmentation. Popliteal Vein: No evidence of thrombus. Normal compressibility, respiratory  phasicity and response to augmentation. Calf Veins: No evidence of thrombus. Normal compressibility and flow on color Doppler imaging. LEFT LOWER EXTREMITY Common Femoral Vein: No evidence of thrombus. Normal compressibility, respiratory phasicity and response to augmentation. Saphenofemoral Junction: No evidence of thrombus. Normal compressibility and flow on color Doppler imaging. Profunda Femoral Vein: No evidence of thrombus. Normal compressibility and flow on color Doppler imaging. Femoral Vein: No evidence of thrombus. Normal compressibility, respiratory phasicity and response to augmentation. Popliteal Vein: No evidence of thrombus. Normal compressibility, respiratory phasicity and response to augmentation. Calf Veins: No evidence of thrombus. Normal compressibility and flow on color Doppler imaging. Other Findings: Left popliteal fossa thick-walled cystic structure without vascular flow measures 5.1 x 1.2 x 3.0 cm compatible with a Baker's cyst. IMPRESSION: No evidence of deep venous thrombosis  in either lower extremity. Electronically Signed   By: Jerilynn Mages.  Shick M.D.   On: 10/11/2019 13:23   ECHOCARDIOGRAM COMPLETE  Result Date: 10/11/2019    ECHOCARDIOGRAM REPORT   Patient Name:   MYLIYAH REBUCK Date of Exam: 10/11/2019 Medical Rec #:  [id number]       Height:       59.0 in Accession #:    [id number]      Weight:       96.3 lb Date of Birth:  [birth year]        BSA:          1.352 m Patient Age:    41 years        BP:           122/65 mmHg Patient Gender: F               HR:           52 bpm. Exam Location:  [facility] Procedure: 2D Echo Indications:    Dyspnea 786.09 / R06.00  History:        Patient has no prior history of Echocardiogram examinations.                 Risk Factors:Current Smoker. Lobar pneumonia , SIRS (systemic                 inflammatory response syndrome, Anemia.  Sonographer:    Leavy Cella RDCS (AE) Referring Phys: [phone] Dakisha Schoof IMPRESSIONS  1. Left ventricular ejection fraction, by estimation, is >75%. The left ventricle has hyperdynamic function. The left ventricle has no regional wall motion abnormalities. Left ventricular diastolic parameters were normal.  2. Right ventricular systolic function is normal. The right ventricular size is normal. There is severely elevated pulmonary artery systolic pressure.  3. Left atrial size was severely dilated.  4. Right atrial size was mild to moderately dilated.  5. The mitral valve is normal in structure. Trivial mitral valve regurgitation.  6. The aortic valve is tricuspid. Aortic valve regurgitation is not visualized. Mild to moderate aortic valve sclerosis/calcification is present, without any evidence of aortic stenosis.  7. The inferior vena cava is dilated in size with >50% respiratory variability, suggesting right atrial pressure of 8 mmHg. FINDINGS  Left Ventricle: Left ventricular ejection fraction, by estimation, is >75%. The left ventricle has hyperdynamic function. The left ventricle has no regional wall motion abnormalities.  The left ventricular internal cavity size was normal in size. There is no left ventricular hypertrophy. Left ventricular diastolic parameters were normal. Right Ventricle: The right ventricular size is normal. No increase in right ventricular wall thickness. Right ventricular systolic function is normal. There is  severely elevated pulmonary artery systolic pressure. The tricuspid regurgitant velocity is 3.66 m/s, and with an assumed right atrial pressure of 10 mmHg, the estimated right ventricular systolic pressure is 20.1 mmHg. Left Atrium: Left atrial size was severely dilated. Right Atrium: Right atrial size was mild to moderately dilated. Pericardium: There is no evidence of pericardial effusion. Mitral Valve: The mitral valve is normal in structure. Trivial mitral valve regurgitation. Tricuspid Valve: The tricuspid valve is normal in structure. Tricuspid valve regurgitation is mild. Aortic Valve: The aortic valve is tricuspid. Aortic valve regurgitation is not visualized. Mild to moderate aortic valve sclerosis/calcification is present, without any evidence of aortic stenosis. Pulmonic Valve: The pulmonic valve was not well visualized. Pulmonic valve regurgitation is not visualized. Aorta: The aortic root is normal in size and structure. Venous: The inferior vena cava is dilated in size with greater than 50% respiratory variability, suggesting right atrial pressure of 8 mmHg. IAS/Shunts: No atrial level shunt detected by color flow Doppler.  LEFT VENTRICLE PLAX 2D LVIDd:         3.81 cm  Diastology LVIDs:         1.85 cm  LV e' lateral:   10.80 cm/s LV PW:         0.96 cm  LV E/e' lateral: 7.4 LV IVS:        0.96 cm  LV e' medial:    8.49 cm/s LVOT diam:     1.80 cm  LV E/e' medial:  9.4 LVOT Area:     2.54 cm  RIGHT VENTRICLE RV S prime:     14.90 cm/s TAPSE (M-mode): 2.3 cm LEFT ATRIUM             Index       RIGHT ATRIUM           Index LA diam:        4.40 cm 3.25 cm/m  RA Area:     18.50 cm LA Vol  (A2C):   98.8 ml 73.07 ml/m RA Volume:   55.90 ml  41.34 ml/m LA Vol (A4C):   74.1 ml 54.80 ml/m LA Biplane Vol: 89.8 ml 66.41 ml/m   AORTA Ao Root diam: 2.70 cm MITRAL VALVE               TRICUSPID VALVE MV Area (PHT): 3.53 cm    TR Peak grad:   53.6 mmHg MV Decel Time: 215 msec    TR Vmax:        366.00 cm/s MV E velocity: 80.00 cm/s MV A velocity: 42.20 cm/s  SHUNTS MV E/A ratio:  1.90        Systemic Diam: 1.80 cm Dorris Carnes MD Electronically signed by Dorris Carnes MD Signature Date/Time: 10/11/2019/3:34:18 PM    Final     Orson Eva, DO  [facility]  If 7PM-7AM, please contact night-coverage [email or web address] Password [email or web address] 10/13/2019, 9:59 AM   LOS: 3 days

## 2019-10-13 NOTE — Progress Notes (Addendum)
Came to bedside to meet spouse and daughter -had further goals of care discussion with daughter -updated daughter on patient's clinical situation -discussed overall poor prognosis in the setting of patient's multiple co-morbidities, frailty and current critical illness -offered emotional support and used reflective/active listening -daughter and spouse agree with overall poor prognosis -they wish to transition patient's focus of care to comfort measures.  They do not want any further blood work, radiographs.   -plan to continue BiPAP and current level of care without any further escalation. -after all family has visited, plan to remove BiPAP compassionately and focus of full comfort measures  Total time 30 min  DTat

## 2019-10-13 NOTE — Progress Notes (Signed)
Patient requested to come off bipap notified RT. Patient placed on 5L high flow sats in 80's. Patient oxygen titrated up currently at 7L high flow tolerating well.

## 2019-10-13 NOTE — Progress Notes (Signed)
Pt family was ready for pt to be transitioned off bi-pap and be placed onto comfort measures. Pt spouse and daughter at bedside. Morphine given, then bi-pap removed and patient then placed on 5L of oxygen via nasal cannula. Patient current oxygen saturation range between 83-85%. Pt is resting quietly, besides having labored breathing. Will continue to monitor and pass on to night shift RN.

## 2019-10-13 NOTE — Progress Notes (Signed)
Subjective:  Patient has not had any melena or rectal bleeding according nursing staff.  Patient's family members concerned that she is in pain and pain control is not adequate.  They also noted she was having difficulty swallowing pills.  Current Medications:  Current Facility-Administered Medications:  .  0.9 %  sodium chloride infusion, 10 mL/hr, Intravenous, Once, Maudie Flakes, MD .  acetaminophen (TYLENOL) tablet 650 mg, 650 mg, Oral, Q6H PRN **OR** acetaminophen (TYLENOL) suppository 650 mg, 650 mg, Rectal, Q6H PRN, Tat, David, MD .  azithromycin (ZITHROMAX) tablet 500 mg, 500 mg, Oral, Daily, Tat, David, MD, 500 mg at 10/13/19 0807 .  budesonide (PULMICORT) nebulizer solution 0.5 mg, 0.5 mg, Nebulization, BID, Tat, David, MD, 0.5 mg at 10/13/19 0717 .  chlorhexidine (PERIDEX) 0.12 % solution 15 mL, 15 mL, Mouth Rinse, BID, Tat, David, MD, 15 mL at 10/13/19 0807 .  Chlorhexidine Gluconate Cloth 2 % PADS 6 each, 6 each, Topical, Daily, Tat, David, MD, 6 each at 10/13/19 [phone]  feeding supplement (ENSURE ENLIVE) (ENSURE ENLIVE) liquid 237 mL, 237 mL, Oral, BID BM, Tat, David, MD, 237 mL at 10/12/19 0858 .  furosemide (LASIX) injection 40 mg, 40 mg, Intravenous, Daily, Tat, David, MD, 40 mg at 10/13/19 0807 .  HYDROcodone-acetaminophen (NORCO/VICODIN) 5-325 MG per tablet 1 tablet, 1 tablet, Oral, Q4H PRN, Orson Eva, MD, 1 tablet at 10/13/19 0807 .  ipratropium-albuterol (DUONEB) 0.5-2.5 (3) MG/3ML nebulizer solution 3 mL, 3 mL, Nebulization, Q6H, Tat, David, MD, 3 mL at 10/13/19 1223 .  MEDLINE mouth rinse, 15 mL, Mouth Rinse, q12n4p, Tat, David, MD .  methylPREDNISolone sodium succinate (SOLU-MEDROL) 125 mg/2 mL injection 60 mg, 60 mg, Intravenous, Q8H, Tat, David, MD .  ondansetron (ZOFRAN) tablet 4 mg, 4 mg, Oral, Q6H PRN **OR** ondansetron (ZOFRAN) injection 4 mg, 4 mg, Intravenous, Q6H PRN, Tat, David, MD .  pantoprazole (PROTONIX) 80 mg in sodium chloride 0.9 % 100 mL (0.8 mg/mL)  infusion, 8 mg/hr, Intravenous, Continuous, Tat, David, MD, Last Rate: 10 mL/hr at 10/13/19 1012, 8 mg/hr at 10/13/19 1012 .  [START ON 10/14/2019] pantoprazole (PROTONIX) injection 40 mg, 40 mg, Intravenous, Q12H, Maudie Flakes, MD .  piperacillin-tazobactam (ZOSYN) IVPB 3.375 g, 3.375 g, Intravenous, Q8H, Tat, David, MD   Objective: Blood pressure 127/78, pulse 100, temperature (!) 97.3 F (36.3 C), temperature source Axillary, resp. rate (!) 30, height 4' 11"  (1.499 m), weight 43.5 kg, SpO2 100 %. Patient is on BiPAP tachypnea and active accessory muscles. Cardiac exam with regular rhythm normal S1 and S2.  No murmur gallop noted. Auscultation lungs reveal vesicular breath sounds bilaterally. Abdomen is flat.  Bowel sounds are normal.  On palpation is soft and nontender with organomegaly or masses. She has trace edema involving her feet.  She has OpSite over left leg ulcer noted on admission.   Labs/studies Results:  CBC Latest Ref Rng & Units 10/13/2019 10/12/2019 10/11/2019  WBC 4.0 - 10.5 K/uL 24.4(H) 23.2(H) 18.3(H)  Hemoglobin 12.0 - 15.0 g/dL 10.6(L) 10.1(L) 9.0(L)  Hematocrit 36.0 - 46.0 % 32.8(L) 31.0(L) 26.9(L)  Platelets 150 - 400 K/uL 503(H) 494(H) 381    CMP Latest Ref Rng & Units 10/13/2019 10/12/2019 10/11/2019  Glucose 70 - 99 mg/dL 135(H) 104(H) 68(L)  BUN 8 - 23 mg/dL 44(H) 33(H) 26(H)  Creatinine 0.44 - 1.00 mg/dL 0.65 0.39(L) 0.32(L)  Sodium 135 - 145 mmol/L 145 145 143  Potassium 3.5 - 5.1 mmol/L 4.1 4.1 3.8  Chloride 98 - 111 mmol/L  117(H) 117(H) 114(H)  CO2 22 - 32 mmol/L 18(L) 19(L) 21(L)  Calcium 8.9 - 10.3 mg/dL 7.3(L) 7.4(L) 7.3(L)  Total Protein 6.5 - 8.1 g/dL - - 4.7(L)  Total Bilirubin 0.3 - 1.2 mg/dL - - 0.9  Alkaline Phos 38 - 126 U/L - - 84  AST 15 - 41 U/L - - 31  ALT 0 - 44 U/L - - 28    Hepatic Function Latest Ref Rng & Units 10/11/2019 10/31/2019  Total Protein 6.5 - 8.1 g/dL 4.7(L) 5.3(L)  Albumin 3.5 - 5.0 g/dL 2.0(L) 2.4(L)  AST 15 - 41 U/L 31 34   ALT 0 - 44 U/L 28 32  Alk Phosphatase 38 - 126 U/L 84 92  Total Bilirubin 0.3 - 1.2 mg/dL 0.9 0.7  Bilirubin, Direct 0.0 - 0.2 mg/dL - 0.2    Prolactin level 1.63.  Yesterday it was 0.34 and  on admission was 0.16  C. difficile antigen and toxin negative  ABGs on FiO2 of 50%.  pH 7.293, PCO2 42.4 and PO2 58.3.  Assessment:  #1.  GI bleed.  History of melena.  Patient has received 2 units of PRBCs since admission.  Hemoglobin heading in the right direction.  No evidence of ongoing or recurrent GI bleed.  Patient has multiple risk factors for peptic ulcer disease and she could also have gastroesophageal reflux disease.   Esophagogastroduodenoscopy on hold until respiratory status improves. We will continue IV pantoprazole.  She will be transition from infusion to 40 mg every 12 hours later today.  #2.  Acute respiratory failure with hypoxemia.  Patient is on BiPAP.  Condition not improving.  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  Patient is on azithromycin, piperacillin-tazobactam as well as IV steroids.  #3.  Diarrhea.  C. difficile antigen and toxin are negative.  Patient at risk for C. difficile colitis given that she is on broad-spectrum antibiotic.  #4.  Acute on chronic anemia.  Suspect chronic disease.  Serum iron TIBC and saturation are low but ferritin is normal.  Agree with Dr. Doristine Devoid plan to begin p.o. iron once GI evaluation completed.  #5.  Rheumatoid arthritis.  Patient has debilitating disease.  Patient has been on multiple medications including methotrexate and Simponi along with low dose steroid.  He has chronic pain according to her family members.  #6.  Severe malnutrition.  Oral intake extremely poor.  Unless this improves we will have to consider enteral feeding.  Recommendations  Continue pantoprazole IV.  Dose to be changed to 40 mg every 12 hours as 72-hour expired on infusion. CBC in a.m.

## 2019-10-14 DIAGNOSIS — Z7189 Other specified counseling: Secondary | ICD-10-CM

## 2019-10-14 LAB — THYROGLOBULIN ANTIBODY: Thyroglobulin Antibody: <1 IU/mL (ref 0.0–0.9)

## 2019-10-14 MED ORDER — MORPHINE SULFATE (PF) 4 MG/ML IV SOLN
4.0000 mg | INTRAVENOUS | Status: DC | PRN
  Administered 2019-10-14 – 2019-10-15 (×14): 4 mg via INTRAVENOUS
  Filled 2019-10-14 (×14): qty 1

## 2019-10-14 NOTE — Care Management Important Message (Signed)
Important Message  Patient Details  Name: Sheri Espinoza MRN: [id number] Date of Birth: 1950-08-31   Medicare Important Message Given:  Yes     Tommy Medal 10/14/2019, 4:37 PM

## 2019-10-14 NOTE — Progress Notes (Signed)
PROGRESS NOTE  Sheri Espinoza CVE:[id number] DOB: 28-Feb-1951 DOA: 10/26/2019 PCP: Josem Kaufmann, MD  Brief History: 69-year-old female with medical history ofanxiety/depression, rheumatoid arthritis, diverticulosis, hypertension, COPD, depression presenting with failure to thrive symptoms for the past 2 weeks. History is supplemented from the patient's spouse at the bedside. He states that the patient has had fatigue, decline in functional status, decreased oral intake for the better part of the month. She has lost 15 pounds in the past month. He states that the patient previously was able to perform all her ADLs without any assistive devices. However in the past 2 weeks, she has had a significant functional decline resulting in requiring assistance with her activities of daily living and making transfers. The patient has been having loose stools that has been melanotic. Apparently, the patient has had a history of loose stools in the past. It is unclear the duration of her melanotic stools. There is no hematochezia. There is no history of fevers, chills, chest pain, shortness breath, coughing, hemoptysis, abdominal pain, dysuria, hematuria. However, the patient has had decreased oral intake and unsteady gait and generalized weakness. There is no headaches, visual disturbance, syncope. She has not been placed on any antibiotics recently. She takes methotrexate every Friday for her rheumatoid arthritis. She continues to smoke 1 pack/day with a 40-pack-year history. According to the patient's spouse, patient has not been on any new medications although she does take Imodium on a as needed basis. The patient's family called EMS on the evening of 6-21. However, the patient refused to go to the emergency department. She went to see her PCP on the morning of 10/19/2019. Routine blood work was performed and showed WBC 20,000 and hemoglobin 7. The patient was contacted and directed to go to the  emergency department. In the emergency department, the patient was afebrile with soft blood pressures. Systolic blood pressures were in the mid 90s. Oxygen saturation was 98% on 4 L. Chest x-ray showed increased interstitial markings. BMP showed sodium 139, potassium 3.7, CO2 24, serum creatinine 0.34. WBC 20.0, hemoglobin 6.9, platelets 172,000. Hemoccult was positive with brown/dark stool. EKG shows sinus rhythm with nonspecific T wave changes.  On the evening 10/12/19, patient's condition declined.  She became more sob and required BiPAP. Her WBC count count continued to rise despite abx.  Her abx coverage was broadened and steroids were added.  Multiple goals of care discussions were held with patient's spouse and daughter.  They were updated on patient's clinical situation.  We discussed her overall poor prognosis in the setting of the patient's co-morbidities, frailty and current critical illness.  Ultimately, the family did not want the patient to suffer.  They wanted to change her focus of care to be directed soley on her sole comfort.   Subsequently, medications with curative intent were discontinued and further diagnostic studies were discontinued.  Further care was directed soley on her comfort.   Assessment/Plan: Sepsis -present on admission -due to pneumonia vs urine -Follow bloodand urinecultures -Check lactic acid1,4 -Check procalcitonin0.16>>0.33 -Obtain UA--11-20 WBC -continueempiric cefepime-->ceftriaxone -abx coverage broadened to zosyn -add azithro -continue IVF>>saline lock -WBC slowly increasing -repeat blood cultures--neg to date -after goals of care discussions with family, they wanted to change her focus of care to be directed soley on her sole comfort.   Acute blood loss anemia -No prior records to clarify the patient's hemoglobin baseline -GI consultappreciated-->possible endoscopy -Transfused 2 unitsPRBCs-->hgb remains stable -Continue Protonix  drip -Check  coags--INR 1.2, PTT 40  Acute respiratory failure with hypoxia -due topneumonia in setting ofunderlying COPD -CT chest--diffuse airspace disease RUL; patchy GGO LUL and LLL -respiratory status worsen overnight-->BiPAP -wean for saturation 90-92% -repeat CXR--increasing infiltrates -check ABG--7.293/42/58/19 (0.5) -follow sputum culture--pseudomonas  -after goals of care discussions with family, they wanted to change her focus of care to be directed soley on her sole comfort.   Acute diastolic CHF -primarily cor pulmonale -10/11/19 Echo--EF >75%; severely elevated PASP -continue IV furosemide -daily weight -accurate I/Os -after goals of care discussions with family, they wanted to change her focus of care to be directed soley on her sole comfort.   COPD exacerbation -started IV solumedrol -started duoneb -started pulmicort -after goals of care discussions with family, they wanted to change her focus of care to be directed soley on her sole comfort.   Right leg cellulitis -continue abx as above --after goals of care discussions with family, they wanted to change her focus of care to be directed soley on her sole comfort.   Diarrhea -check Cdiff--neg  Failure to thrive -Serum J24>2683 -Folic MHDQ--22.2 -LNL8.921 -PT eval->SNF  Left lower extremity ulceration -Wound care consultappreciated -See pictureson 10/21/2019 -venous duplex--neg for DVT  Thrombocytosis -Due to combination of acute phase reaction as well as likely iron deficiency -Check iron studies--iron sat 3%, ferritin 47 -replace iron once GI evaluation is finished  Rheumatoid arthritis -Holding methotrexate in the setting of Sepsis  Hyperthyroidism -TSH 0.071 -Free T4 1.20 -check thyroperoxidase and thyroglobulin ab -outpt endocrine referral  COPD/tobacco abuse -continueduonebs -continuepulmicort  HTN -holding HCTZ/irbesartan  Hyperlipidemia -restart  statin  Goals of Care -discussed with spouse 6/6 -Advance care planning, including the explanation and discussion of advance directives was carried out with the patient and family.  Code status including explanations of "Full Code" and "DNR" and alternatives were discussed in detail.  Discussion of end-of-life issues including but not limited palliative care, hospice care and the concept of hospice, other end-of-life care options, power of attorney for health care decisions, living wills, and physician orders for life-sustaining treatment were also discussed with the patient and family.  Total face to face time 16 minutes. -spouse wishes for FULL CODE -discussed pt's clinical decline and overall poor prognosis in setting of patient's frailty and multiple comorbidities       Status is: Inpatient  Remains inpatient appropriate because:IV treatments appropriate due to intensity of illness or inability to take PO -fluid overloaded on IV lasix; requires IV abx;    Dispo: The patient is from:Home Anticipated d/c is JH:ERDEYCXK death Anticipated d/c date is: n/a Patient currentlyis not medically stable to d/c.        Family Communication:Spouse updatedat bedside 6/7  Consultants:GI  Code Status: FULL   DVT Prophylaxis: SCDs   Procedures: As Listed in Progress Note Above  Antibiotics: Ceftriaxone 6/5>>> Cefepime 6/3>>>6/5 Azithro6/4>>> vanc6/3     Subjective: Pt resting comfortably.  No respiratory distress.  No vomiting.  No uncontrolled pain  Objective: Vitals:   10/14/19 0800 10/14/19 1006 10/14/19 1100 10/14/19 1452  BP:      Pulse: 87  86 88  Resp: (!) 23  (!) 22 18  Temp:  (!) 100.6 F (38.1 C)    TempSrc:  Axillary    SpO2: 91%  92% 93%  Weight:      Height:        Intake/Output Summary (Last 24 hours) at 10/14/2019 1644 Last data filed at 10/14/2019 1000 Gross per 24 hour  Intake 130.21 ml  Output --  Net 130.21 ml   Weight change:  Exam:   General:  Pt is alert, no distress  HEENT: , No neck mass, [place]/AT  Cardiovascular: RRR, S1/S2, no rubs, no gallops  Respiratory: bilateral scattered rhonchi  Abdomen: Soft/+BS, non tender, non distended, no guarding  Extremities: No edema, No lymphangitis, No petechiae   Data Reviewed: I have personally reviewed following labs and imaging studies Basic Metabolic Panel: Recent Labs  Lab 10/21/2019 1042 10/11/19 0441 10/12/19 0448 10/13/19 0301  NA 139 143 145 145  K 3.7 3.8 4.1 4.1  CL 104 114* 117* 117*  CO2 24 21* 19* 18*  GLUCOSE 87 68* 104* 135*  BUN 27* 26* 33* 44*  CREATININE 0.34* 0.32* 0.39* 0.65  CALCIUM 8.1* 7.3* 7.4* 7.3*  MG  --   --  1.7 2.2   Liver Function Tests: Recent Labs  Lab 10/26/2019 1042 10/11/19 0441  AST 34 31  ALT 32 28  ALKPHOS 92 84  BILITOT 0.7 0.9  PROT 5.3* 4.7*  ALBUMIN 2.4* 2.0*   No results for input(s): LIPASE, AMYLASE in the last 168 hours. No results for input(s): AMMONIA in the last 168 hours. Coagulation Profile: Recent Labs  Lab 11/05/2019 1343  INR 1.2   CBC: Recent Labs  Lab 10/09/2019 1042 10/11/19 0441 10/12/19 0448 10/13/19 0301  WBC 20.2* 18.3* 23.2* 24.4*  NEUTROABS 18.9*  --   --   --   HGB 6.9* 9.0* 10.1* 10.6*  HCT 20.5* 26.9* 31.0* 32.8*  MCV 94.5 92.1 93.1 92.4  PLT 472* 381 494* 503*   Cardiac Enzymes: No results for input(s): CKTOTAL, CKMB, CKMBINDEX, TROPONINI in the last 168 hours. BNP: Invalid input(s): POCBNP CBG: No results for input(s): GLUCAP in the last 168 hours. HbA1C: No results for input(s): HGBA1C in the last 72 hours. Urine analysis:    Component Value Date/Time   COLORURINE AMBER (A) 10/14/2019 1336   APPEARANCEUR HAZY (A) 11/02/2019 1336   LABSPEC 1.023 11/01/2019 1336   PHURINE 5.0 10/14/2019 1336   GLUCOSEU NEGATIVE 10/09/2019 1336   HGBUR NEGATIVE 10/14/2019 1336   BILIRUBINUR NEGATIVE  10/30/2019 1336   KETONESUR NEGATIVE 10/08/2019 1336   PROTEINUR NEGATIVE 10/27/2019 1336   NITRITE POSITIVE (A) 10/08/2019 1336   LEUKOCYTESUR NEGATIVE 10/09/2019 1336   Sepsis Labs: @LABRCNTIP (procalcitonin:4,lacticidven:4) ) Recent Results (from the past 240 hour(s))  Culture, blood (single) w Reflex to ID Panel     Status: None (Preliminary result)   Collection Time: 10/19/2019 10:59 AM   Specimen: BLOOD RIGHT FOREARM  Result Value Ref Range Status   Specimen Description BLOOD RIGHT FOREARM  Final   Special Requests   Final    BOTTLES DRAWN AEROBIC AND ANAEROBIC Blood Culture adequate volume   Culture   Final    NO GROWTH 4 DAYS Performed at [facility], [address] [place],  [address]    Report Status PENDING  Incomplete  SARS Coronavirus 2 by RT PCR (hospital order, performed in [place] hospital lab) Nasopharyngeal Nasopharyngeal Swab     Status: None   Collection Time: 10/09/2019  1:35 PM   Specimen: Nasopharyngeal Swab  Result Value Ref Range Status   SARS Coronavirus 2 NEGATIVE NEGATIVE Final    Comment: (NOTE) SARS-CoV-2 target nucleic acids are NOT DETECTED. The SARS-CoV-2 RNA is generally detectable in upper and lower respiratory specimens during the acute phase of infection. The lowest concentration of SARS-CoV-2 viral copies this assay can detect is 250  copies / mL. A negative result does not preclude SARS-CoV-2 infection and should not be used as the sole basis for treatment or other patient management decisions.  A negative result may occur with improper specimen collection / handling, submission of specimen other than nasopharyngeal swab, presence of viral mutation(s) within the areas targeted by this assay, and inadequate number of viral copies (<250 copies / mL). A negative result must be combined with clinical observations, patient history, and epidemiological information. Fact Sheet for Patients:    StrictlyIdeas.no Fact Sheet for Healthcare Providers: BankingDealers.co.za This test is not yet approved or cleared  by the [place] FDA and has been authorized for detection and/or diagnosis of SARS-CoV-2 by FDA under an Emergency Use Authorization (EUA).  This EUA will remain in effect (meaning this test can be used) for the duration of the COVID-19 declaration under Section 564(b)(1) of the Act, 21 U.S.C. section 360bbb-3(b)(1), unless the authorization is terminated or revoked sooner. Performed at [facility], [address], [place], [place] [address]   Culture, Urine     Status: Abnormal   Collection Time: 10/19/2019  1:36 PM   Specimen: Urine, Clean Catch  Result Value Ref Range Status   Specimen Description   Final    URINE, CLEAN CATCH Performed at [facility], [address], [place], [place] [address]    Special Requests   Final    NONE Performed at [facility], [address] [place], [place] [address]    Culture >=100,000 COLONIES/mL KLEBSIELLA PNEUMONIAE (A)  Final   Report Status 10/12/2019 FINAL  Final   Organism ID, Bacteria KLEBSIELLA PNEUMONIAE (A)  Final      Susceptibility   Klebsiella pneumoniae - MIC*    AMPICILLIN RESISTANT Resistant     CEFAZOLIN <=4 SENSITIVE Sensitive     CEFTRIAXONE <=1 SENSITIVE Sensitive     CIPROFLOXACIN <=0.25 SENSITIVE Sensitive     GENTAMICIN <=1 SENSITIVE Sensitive     IMIPENEM <=0.25 SENSITIVE Sensitive     NITROFURANTOIN <=16 SENSITIVE Sensitive     TRIMETH/SULFA <=20 SENSITIVE Sensitive     AMPICILLIN/SULBACTAM <=2 SENSITIVE Sensitive     PIP/TAZO <=4 SENSITIVE Sensitive     * >=100,000 COLONIES/mL KLEBSIELLA PNEUMONIAE  MRSA PCR Screening     Status: None   Collection Time: 11/04/2019  1:45 PM   Specimen: Nasal Mucosa; Nasopharyngeal  Result Value Ref Range Status   MRSA by PCR NEGATIVE NEGATIVE Final    Comment:        The GeneXpert MRSA Assay (FDA approved for  NASAL specimens only), is one component of a comprehensive MRSA colonization surveillance program. It is not intended to diagnose MRSA infection nor to guide or monitor treatment for MRSA infections. Performed at [facility], [address] [place], [place] [address]   Expectorated sputum assessment w rflx to resp cult     Status: None   Collection Time: 10/12/19  4:30 PM   Specimen: Expectorated Sputum  Result Value Ref Range Status   Specimen Description EXPECTORATED SPUTUM  Final   Special Requests NONE  Final   Sputum evaluation   Final    THIS SPECIMEN IS ACCEPTABLE FOR SPUTUM CULTURE Performed at [facility], [address] [place], [place] [address]    Report Status 10/12/2019 FINAL  Final  Culture, respiratory     Status: None (Preliminary result)   Collection Time: 10/12/19  4:30 PM  Result Value Ref Range Status   Specimen Description   Final  EXPECTORATED SPUTUM Performed at [facility], [address] [place], [place] [address]    Special Requests   Final    NONE Reflexed from [phone] Performed at [facility], [address], [place], [place] [address]    Gram Stain   Final    RARE WBC PRESENT, PREDOMINANTLY PMN RARE YEAST RARE GRAM POSITIVE COCCI    Culture   Final    ABUNDANT YEAST RARE PSEUDOMONAS AERUGINOSA CULTURE REINCUBATED FOR BETTER GROWTH Performed at [facility], [place] [address], [place], [place] [address]    Report Status PENDING  Incomplete  C Difficile Quick Screen w PCR reflex     Status: None   Collection Time: 10/12/19  7:23 PM   Specimen: STOOL  Result Value Ref Range Status   C Diff antigen NEGATIVE NEGATIVE Final   C Diff toxin NEGATIVE NEGATIVE Final   C Diff interpretation No C. difficile detected.  Final    Comment: Performed at [facility], [address] [place] [address], [place], [place] [address]  Culture, blood (Routine X 2) w Reflex to ID Panel     Status: None (Preliminary result)   Collection Time: 10/13/19 10:35 AM   Specimen:  BLOOD LEFT WRIST  Result Value Ref Range Status   Specimen Description BLOOD LEFT WRIST BOTTLES DRAWN AEROBIC ONLY  Final   Special Requests Blood Culture adequate volume  Final   Culture   Final    NO GROWTH < 24 HOURS Performed at [facility], [address] [place], [place] [address]    Report Status PENDING  Incomplete  Culture, blood (Routine X 2) w Reflex to ID Panel     Status: None (Preliminary result)   Collection Time: 10/13/19 10:48 AM   Specimen: BLOOD  Result Value Ref Range Status   Specimen Description BLOOD  Final   Special Requests NONE  Final   Culture   Final    NO GROWTH < 24 HOURS Performed at [facility], [address] [place],  [address]    Report Status PENDING  Incomplete     Scheduled Meds: . ipratropium-albuterol  3 mL Nebulization Q6H  . mouth rinse  15 mL Mouth Rinse q12n4p   Continuous Infusions:  Procedures/Studies: DG Chest 1 View  Result Date: 10/13/2019 CLINICAL DATA:  Respiratory distress. History of asthma and hypertension. EXAM: CHEST  1 VIEW COMPARISON:  11/03/2019 FINDINGS: Patchy opacities are identified throughout the lungs bilaterally, slightly increased compared to prior. There is developing LEFT LOWER lobe consolidation. IMPRESSION: 1. Increased bilateral airspace opacities. 2. Developing LEFT LOWER lobe consolidation. Electronically Signed   By: Nolon Nations M.D.   On: 10/13/2019 10:57   DG Chest 2 View  Result Date: 10/25/2019 CLINICAL DATA:  Weakness. EXAM: CHEST - 2 VIEW COMPARISON:  None. FINDINGS: The heart size and mediastinal contours are within normal limits. Atherosclerosis of thoracic aorta is noted. No pneumothorax is noted. Central pulmonary vascular congestion is noted with possible bilateral pulmonary edema. Small pleural effusions are noted. The visualized skeletal structures are unremarkable. IMPRESSION: Central pulmonary vascular congestion may be present with possible bilateral pulmonary edema and small  pleural effusions. Electronically Signed   By: Marijo Conception M.D.   On: 10/09/2019 11:50   CT CHEST WO CONTRAST  Result Date: 11/04/2019 CLINICAL DATA:  Shortness of breath EXAM: CT CHEST WITHOUT CONTRAST TECHNIQUE: Multidetector CT imaging of the chest was performed following the standard protocol without IV contrast. COMPARISON:  Chest x-ray today FINDINGS: Cardiovascular: Heart is  normal size. Coronary artery and aortic atherosclerosis. No evidence of aortic aneurysm. Mediastinum/Nodes: No visible adenopathy. Trachea and esophagus are unremarkable. Thyroid unremarkable. Lungs/Pleura: Small bilateral pleural effusions. Diffuse airspace disease throughout much of the right upper lobe. Patchy ground-glass airspace opacities throughout the left upper lobe with more confluent opacity in the left lower lobe. Findings concerning for pneumonia. Underlying emphysema. Upper Abdomen: Imaging into the upper abdomen shows no acute findings. Musculoskeletal: Chest wall soft tissues are unremarkable. No acute bony abnormality. IMPRESSION: Multifocal airspace disease, most confluent in the right upper lobe and left lower lobe most compatible with multifocal pneumonia. Small bilateral pleural effusions. Coronary artery disease. Aortic Atherosclerosis (ICD10-I70.0) and Emphysema (ICD10-J43.9). Electronically Signed   By: Rolm Baptise M.D.   On: 10/09/2019 20:38   US Venous Img Lower Bilateral (DVT)  Result Date: 10/11/2019 CLINICAL DATA:  Shortness of breath, lower extremity edema, pain EXAM: BILATERAL LOWER EXTREMITY VENOUS DOPPLER ULTRASOUND TECHNIQUE: Gray-scale sonography with graded compression, as well as color Doppler and duplex ultrasound were performed to evaluate the lower extremity deep venous systems from the level of the common femoral vein and including the common femoral, femoral, profunda femoral, popliteal and calf veins including the posterior tibial, peroneal and gastrocnemius veins when visible. The  superficial great saphenous vein was also interrogated. Spectral Doppler was utilized to evaluate flow at rest and with distal augmentation maneuvers in the common femoral, femoral and popliteal veins. COMPARISON:  None. FINDINGS: RIGHT LOWER EXTREMITY Common Femoral Vein: No evidence of thrombus. Normal compressibility, respiratory phasicity and response to augmentation. Saphenofemoral Junction: No evidence of thrombus. Normal compressibility and flow on color Doppler imaging. Profunda Femoral Vein: No evidence of thrombus. Normal compressibility and flow on color Doppler imaging. Femoral Vein: No evidence of thrombus. Normal compressibility, respiratory phasicity and response to augmentation. Popliteal Vein: No evidence of thrombus. Normal compressibility, respiratory phasicity and response to augmentation. Calf Veins: No evidence of thrombus. Normal compressibility and flow on color Doppler imaging. LEFT LOWER EXTREMITY Common Femoral Vein: No evidence of thrombus. Normal compressibility, respiratory phasicity and response to augmentation. Saphenofemoral Junction: No evidence of thrombus. Normal compressibility and flow on color Doppler imaging. Profunda Femoral Vein: No evidence of thrombus. Normal compressibility and flow on color Doppler imaging. Femoral Vein: No evidence of thrombus. Normal compressibility, respiratory phasicity and response to augmentation. Popliteal Vein: No evidence of thrombus. Normal compressibility, respiratory phasicity and response to augmentation. Calf Veins: No evidence of thrombus. Normal compressibility and flow on color Doppler imaging. Other Findings: Left popliteal fossa thick-walled cystic structure without vascular flow measures 5.1 x 1.2 x 3.0 cm compatible with a Baker's cyst. IMPRESSION: No evidence of deep venous thrombosis in either lower extremity. Electronically Signed   By: Jerilynn Mages.  Shick M.D.   On: 10/11/2019 13:23   ECHOCARDIOGRAM COMPLETE  Result Date: 10/11/2019     ECHOCARDIOGRAM REPORT   Patient Name:   JODY AGUINAGA Date of Exam: 10/11/2019 Medical Rec #:  [id number]       Height:       59.0 in Accession #:    [id number]      Weight:       96.3 lb Date of Birth:  09-14-1950        BSA:          1.352 m Patient Age:    41 years        BP:           122/65 mmHg Patient Gender: F  HR:           52 bpm. Exam Location:  [facility] Procedure: 2D Echo Indications:    Dyspnea 786.09 / R06.00  History:        Patient has no prior history of Echocardiogram examinations.                 Risk Factors:Current Smoker. Lobar pneumonia , SIRS (systemic                 inflammatory response syndrome, Anemia.  Sonographer:    Leavy Cella RDCS (AE) Referring Phys: [phone] Jailyn Leeson IMPRESSIONS  1. Left ventricular ejection fraction, by estimation, is >75%. The left ventricle has hyperdynamic function. The left ventricle has no regional wall motion abnormalities. Left ventricular diastolic parameters were normal.  2. Right ventricular systolic function is normal. The right ventricular size is normal. There is severely elevated pulmonary artery systolic pressure.  3. Left atrial size was severely dilated.  4. Right atrial size was mild to moderately dilated.  5. The mitral valve is normal in structure. Trivial mitral valve regurgitation.  6. The aortic valve is tricuspid. Aortic valve regurgitation is not visualized. Mild to moderate aortic valve sclerosis/calcification is present, without any evidence of aortic stenosis.  7. The inferior vena cava is dilated in size with >50% respiratory variability, suggesting right atrial pressure of 8 mmHg. FINDINGS  Left Ventricle: Left ventricular ejection fraction, by estimation, is >75%. The left ventricle has hyperdynamic function. The left ventricle has no regional wall motion abnormalities. The left ventricular internal cavity size was normal in size. There is no left ventricular hypertrophy. Left ventricular diastolic parameters were  normal. Right Ventricle: The right ventricular size is normal. No increase in right ventricular wall thickness. Right ventricular systolic function is normal. There is severely elevated pulmonary artery systolic pressure. The tricuspid regurgitant velocity is 3.66 m/s, and with an assumed right atrial pressure of 10 mmHg, the estimated right ventricular systolic pressure is 42.8 mmHg. Left Atrium: Left atrial size was severely dilated. Right Atrium: Right atrial size was mild to moderately dilated. Pericardium: There is no evidence of pericardial effusion. Mitral Valve: The mitral valve is normal in structure. Trivial mitral valve regurgitation. Tricuspid Valve: The tricuspid valve is normal in structure. Tricuspid valve regurgitation is mild. Aortic Valve: The aortic valve is tricuspid. Aortic valve regurgitation is not visualized. Mild to moderate aortic valve sclerosis/calcification is present, without any evidence of aortic stenosis. Pulmonic Valve: The pulmonic valve was not well visualized. Pulmonic valve regurgitation is not visualized. Aorta: The aortic root is normal in size and structure. Venous: The inferior vena cava is dilated in size with greater than 50% respiratory variability, suggesting right atrial pressure of 8 mmHg. IAS/Shunts: No atrial level shunt detected by color flow Doppler.  LEFT VENTRICLE PLAX 2D LVIDd:         3.81 cm  Diastology LVIDs:         1.85 cm  LV e' lateral:   10.80 cm/s LV PW:         0.96 cm  LV E/e' lateral: 7.4 LV IVS:        0.96 cm  LV e' medial:    8.49 cm/s LVOT diam:     1.80 cm  LV E/e' medial:  9.4 LVOT Area:     2.54 cm  RIGHT VENTRICLE RV S prime:     14.90 cm/s TAPSE (M-mode): 2.3 cm LEFT ATRIUM  Index       RIGHT ATRIUM           Index LA diam:        4.40 cm 3.25 cm/m  RA Area:     18.50 cm LA Vol (A2C):   98.8 ml 73.07 ml/m RA Volume:   55.90 ml  41.34 ml/m LA Vol (A4C):   74.1 ml 54.80 ml/m LA Biplane Vol: 89.8 ml 66.41 ml/m   AORTA Ao Root  diam: 2.70 cm MITRAL VALVE               TRICUSPID VALVE MV Area (PHT): 3.53 cm    TR Peak grad:   53.6 mmHg MV Decel Time: 215 msec    TR Vmax:        366.00 cm/s MV E velocity: 80.00 cm/s MV A velocity: 42.20 cm/s  SHUNTS MV E/A ratio:  1.90        Systemic Diam: 1.80 cm Dorris Carnes MD Electronically signed by Dorris Carnes MD Signature Date/Time: 10/11/2019/3:34:18 PM    Final     Orson Eva, DO  [facility]  If 7PM-7AM, please contact night-coverage [email or web address] Password [email or web address] 10/14/2019, 4:44 PM   LOS: 4 days

## 2019-10-15 LAB — CULTURE, BLOOD (SINGLE)
Culture: NO GROWTH
Special Requests: ADEQUATE

## 2019-10-16 LAB — CULTURE, RESPIRATORY W GRAM STAIN

## 2019-10-18 LAB — CULTURE, BLOOD (ROUTINE X 2)
Culture: NO GROWTH
Culture: NO GROWTH
Special Requests: ADEQUATE

## 2019-11-07 NOTE — Progress Notes (Signed)
Upon arrival to shift pts family was at bedside. Pt was resting comfortably. Breathing was shallow and oxygen probe no longer picking up. Gave pt dose of morphine 4mg  at 2039. Daughter called me to her room about 2045 to check on her. Breathing was very faint and shallow. HR began decreasing/ Explained to family that this was probably nearing the end. Pt stopped breathing but pulse still present and slowing. Pulse diminished and pt expired at 2052. Family tearful but very appreciative of all of the kindness afforded to them and MRS. O[place]ken. Pt family made some calls and said their goodbyes to pt. Pts husband and daughters took all of patients belongings with them when they left.

## 2019-11-07 NOTE — Death Summary Note (Signed)
DEATH SUMMARY   Patient Details  Name: Sheri Espinoza MRN: [id number] DOB: [birth year]  Admission/Discharge Information   Admit Date:  2019-11-06  Date of Death: Date of Death: 2019/11/11  Time of Death: Time of Death: 2050/08/27  Length of Stay: 5  Referring Physician: Josem Kaufmann, MD   Reason(s) for Hospitalization  Failure to thrive; generalized weakness  Diagnoses  Preliminary cause of death: sepsis Secondary Diagnoses (including complications and co-morbidities):  Sepsis -present on admission -due to pneumonia vs urine -Follow bloodand urinecultures -Check lactic acid1,4 -Check procalcitonin0.16>>0.33 -UA--11-20 WBC -continueempiric cefepime-->ceftriaxone -abx coverage broadened to zosyn intially with decompensation -add azithro -continue IVF>>saline lock -WBC slowly increasing -repeat blood cultures--neg to date -after goals of care discussions with family, they wanted to change her focus of care to be directed soley on her sole comfort.  Acute blood loss anemia -No prior records to clarify the patient's hemoglobin baseline -GI consultappreciated-->possible endoscopy -Transfused 2 unitsPRBCs-->hgb remains stable -Continue Protonix drip -Check coags--INR 1.2, PTT 40 -no longer active issue as patient is now full comfort care  Acute respiratory failure with hypoxia -due topneumonia in setting ofunderlying COPD -CT chest--diffuse airspace disease RUL; patchy GGO LUL and LLL -respiratory status worsen overnight-->BiPAP -wean forsaturation 90-92% -repeat CXR--increasing infiltrates -check ABG--7.293/42/58/19 (0.5) -follow sputum culture--pseudomonas  -after goals of care discussions with family, they wanted to change her focus of care to be directed soley on her sole comfort.  Acute diastolic CHF -primarily cor pulmonale -10/11/19 Echo--EF >75%; severely elevated PASP -continueIV furosemide -daily weight -accurate I/Os -after goals of care  discussions with family, they wanted to change her focus of care to be directed soley on her sole comfort.  COPD exacerbation -startedIV solumedrol -startedduoneb -startedpulmicort -after goals of care discussions with family, they wanted to change her focus of care to be directed soley on her sole comfort.  Right leg cellulitis -continue abx as above --after goals of care discussions with family, they wanted to change her focus of care to be directed soley on her sole comfort.  Diarrhea -check Cdiff--neg  Failure to thrive -Serum W09>8119 -Folic JYNW--29.5 -AOZ3.086 -PT eval->SNF  Left lower extremity ulceration -Wound care consultappreciated -See pictureson 11-06-19 -venous duplex--neg for DVT  Thrombocytosis -Due to combination of acute phase reaction as well as likely iron deficiency -Check iron studies--iron sat 3%, ferritin 47 -replace iron once GI evaluation is finished  Rheumatoid arthritis -Holding methotrexate in the setting of Sepsis  Hyperthyroidism -TSH 0.071 -Free T4 1.20 -check thyroperoxidase and thyroglobulin ab -outpt endocrine referral  COPD/tobacco abuse -continueduonebs -continuepulmicort  HTN -holding HCTZ/irbesartan  Hyperlipidemia -restart statin  Goals of Care -discussed with spouse 6/6 -Advance care planning, including the explanation and discussion of advance directives was carried out with the patient and family. Code status including explanations of "Full Code" and "DNR" and alternatives were discussed in detail. Discussion of end-of-life issues including but not limited palliative care, hospice care and the concept of hospice, other end-of-life care options, power of attorney for health care decisions, living wills, and physician orders for life-sustaining treatment were also discussed with the patient and family. Total face to face time 16 minutes. -spouse wishes for FULL CODE -discussed pt's clinical  decline and overall poor prognosis in setting of patient's frailty and multiple comorbidities  Brief Hospital Course (including significant findings, care, treatment, and services provided and events leading to death)  Sheri Espinoza is a 69 y.o.femalewith medical history ofanxiety/depression, rheumatoid arthritis, diverticulosis, hypertension, COPD, depression presenting with failure to thrive symptoms  for the past 2 weeks. History is supplemented from the patient's spouse at the bedside. He states that the patient has had fatigue, decline in functional status, decreased oral intake for the better part of the month. She has lost 15 pounds in the past month. He states that the patient previously was able to perform all her ADLs without any assistive devices. However in the past 2 weeks, she has had a significant functional decline resulting in requiring assistance with her activities of daily living and making transfers. The patient has been having loose stools that has been melanotic. Apparently, the patient has had a history of loose stools in the past. It is unclear the duration of her melanotic stools. There is no hematochezia. There is no history of fevers, chills, chest pain, shortness breath, coughing, hemoptysis, abdominal pain, dysuria, hematuria. However, the patient has had decreased oral intake and unsteady gait and generalized weakness. There is no headaches, visual disturbance, syncope. She has not been placed on any antibiotics recently. She takes methotrexate every Friday for her rheumatoid arthritis. She continues to smoke 1 pack/day with a 40-pack-year history. According to the patient's spouse, patient has not been on any new medications although she does take Imodium on a as needed basis. The patient's family called EMS on the evening of 6-21. However, the patient refused to go to the emergency department. She went to see her PCP on the morning of 11/05/2019. Routine  blood work was performed and showed WBC 20,000 and hemoglobin 7. The patient was contacted and directed to go to the emergency department. In the emergency department, the patient was afebrile with soft blood pressures. Systolic blood pressures were in the mid 90s. Oxygen saturation was 98% on 4 L. Chest x-ray showed increased interstitial markings. BMP showed sodium 139, potassium 3.7, CO2 24, serum creatinine 0.34. WBC 20.0, hemoglobin 6.9, platelets 172,000. Hemoccult was positive with brown/dark stool. EKG shows sinus rhythm with nonspecific T wave changes.On the evening 10/12/19, patient's condition declined. She became more sob and required BiPAP.Her WBC count count continued to rise despite abx. Her abx coverage was broadened and steroids were added. Multiple goals of care discussions were held with patient's spouse and daughter. They were updated on patient's clinical situation. We discussed her overall poor prognosis in the setting of the patient's co-morbidities, frailty and current critical illness. Ultimately, the family did not want the patient to suffer. They wanted to change her focus of care to be directed soley on her sole comfort. Subsequently, medications with curative intent were discontinued and further diagnostic studies were discontinued. Further care was directed soley on her comfort.    Pertinent Labs and Studies  Significant Diagnostic Studies DG Chest 1 View  Result Date: 10/13/2019 CLINICAL DATA:  Respiratory distress. History of asthma and hypertension. EXAM: CHEST  1 VIEW COMPARISON:  10/28/2019 FINDINGS: Patchy opacities are identified throughout the lungs bilaterally, slightly increased compared to prior. There is developing LEFT LOWER lobe consolidation. IMPRESSION: 1. Increased bilateral airspace opacities. 2. Developing LEFT LOWER lobe consolidation. Electronically Signed   By: Nolon Nations M.D.   On: 10/13/2019 10:57   DG Chest 2 View  Result  Date: 10/17/2019 CLINICAL DATA:  Weakness. EXAM: CHEST - 2 VIEW COMPARISON:  None. FINDINGS: The heart size and mediastinal contours are within normal limits. Atherosclerosis of thoracic aorta is noted. No pneumothorax is noted. Central pulmonary vascular congestion is noted with possible bilateral pulmonary edema. Small pleural effusions are noted. The visualized skeletal structures are unremarkable.  IMPRESSION: Central pulmonary vascular congestion may be present with possible bilateral pulmonary edema and small pleural effusions. Electronically Signed   By: Marijo Conception M.D.   On: 10/21/2019 11:50   CT CHEST WO CONTRAST  Result Date: 10/27/2019 CLINICAL DATA:  Shortness of breath EXAM: CT CHEST WITHOUT CONTRAST TECHNIQUE: Multidetector CT imaging of the chest was performed following the standard protocol without IV contrast. COMPARISON:  Chest x-ray today FINDINGS: Cardiovascular: Heart is normal size. Coronary artery and aortic atherosclerosis. No evidence of aortic aneurysm. Mediastinum/Nodes: No visible adenopathy. Trachea and esophagus are unremarkable. Thyroid unremarkable. Lungs/Pleura: Small bilateral pleural effusions. Diffuse airspace disease throughout much of the right upper lobe. Patchy ground-glass airspace opacities throughout the left upper lobe with more confluent opacity in the left lower lobe. Findings concerning for pneumonia. Underlying emphysema. Upper Abdomen: Imaging into the upper abdomen shows no acute findings. Musculoskeletal: Chest wall soft tissues are unremarkable. No acute bony abnormality. IMPRESSION: Multifocal airspace disease, most confluent in the right upper lobe and left lower lobe most compatible with multifocal pneumonia. Small bilateral pleural effusions. Coronary artery disease. Aortic Atherosclerosis (ICD10-I70.0) and Emphysema (ICD10-J43.9). Electronically Signed   By: Rolm Baptise M.D.   On: 10/29/2019 20:38   US Venous Img Lower Bilateral (DVT)  Result Date:  10/11/2019 CLINICAL DATA:  Shortness of breath, lower extremity edema, pain EXAM: BILATERAL LOWER EXTREMITY VENOUS DOPPLER ULTRASOUND TECHNIQUE: Gray-scale sonography with graded compression, as well as color Doppler and duplex ultrasound were performed to evaluate the lower extremity deep venous systems from the level of the common femoral vein and including the common femoral, femoral, profunda femoral, popliteal and calf veins including the posterior tibial, peroneal and gastrocnemius veins when visible. The superficial great saphenous vein was also interrogated. Spectral Doppler was utilized to evaluate flow at rest and with distal augmentation maneuvers in the common femoral, femoral and popliteal veins. COMPARISON:  None. FINDINGS: RIGHT LOWER EXTREMITY Common Femoral Vein: No evidence of thrombus. Normal compressibility, respiratory phasicity and response to augmentation. Saphenofemoral Junction: No evidence of thrombus. Normal compressibility and flow on color Doppler imaging. Profunda Femoral Vein: No evidence of thrombus. Normal compressibility and flow on color Doppler imaging. Femoral Vein: No evidence of thrombus. Normal compressibility, respiratory phasicity and response to augmentation. Popliteal Vein: No evidence of thrombus. Normal compressibility, respiratory phasicity and response to augmentation. Calf Veins: No evidence of thrombus. Normal compressibility and flow on color Doppler imaging. LEFT LOWER EXTREMITY Common Femoral Vein: No evidence of thrombus. Normal compressibility, respiratory phasicity and response to augmentation. Saphenofemoral Junction: No evidence of thrombus. Normal compressibility and flow on color Doppler imaging. Profunda Femoral Vein: No evidence of thrombus. Normal compressibility and flow on color Doppler imaging. Femoral Vein: No evidence of thrombus. Normal compressibility, respiratory phasicity and response to augmentation. Popliteal Vein: No evidence of thrombus.  Normal compressibility, respiratory phasicity and response to augmentation. Calf Veins: No evidence of thrombus. Normal compressibility and flow on color Doppler imaging. Other Findings: Left popliteal fossa thick-walled cystic structure without vascular flow measures 5.1 x 1.2 x 3.0 cm compatible with a Baker's cyst. IMPRESSION: No evidence of deep venous thrombosis in either lower extremity. Electronically Signed   By: Jerilynn Mages.  Shick M.D.   On: 10/11/2019 13:23   ECHOCARDIOGRAM COMPLETE  Result Date: 10/11/2019    ECHOCARDIOGRAM REPORT   Patient Name:   ELANDRA POWELL Date of Exam: 10/11/2019 Medical Rec #:  [id number]       Height:       59.0 in Accession #:  [id number]      Weight:       96.3 lb Date of Birth:  [birth year]        BSA:          1.352 m Patient Age:    43 years        BP:           122/65 mmHg Patient Gender: F               HR:           52 bpm. Exam Location:  [facility] Procedure: 2D Echo Indications:    Dyspnea 786.09 / R06.00  History:        Patient has no prior history of Echocardiogram examinations.                 Risk Factors:Current Smoker. Lobar pneumonia , SIRS (systemic                 inflammatory response syndrome, Anemia.  Sonographer:    Leavy Cella RDCS (AE) Referring Phys: [phone] Cleotha Whalin IMPRESSIONS  1. Left ventricular ejection fraction, by estimation, is >75%. The left ventricle has hyperdynamic function. The left ventricle has no regional wall motion abnormalities. Left ventricular diastolic parameters were normal.  2. Right ventricular systolic function is normal. The right ventricular size is normal. There is severely elevated pulmonary artery systolic pressure.  3. Left atrial size was severely dilated.  4. Right atrial size was mild to moderately dilated.  5. The mitral valve is normal in structure. Trivial mitral valve regurgitation.  6. The aortic valve is tricuspid. Aortic valve regurgitation is not visualized. Mild to moderate aortic valve sclerosis/calcification is  present, without any evidence of aortic stenosis.  7. The inferior vena cava is dilated in size with >50% respiratory variability, suggesting right atrial pressure of 8 mmHg. FINDINGS  Left Ventricle: Left ventricular ejection fraction, by estimation, is >75%. The left ventricle has hyperdynamic function. The left ventricle has no regional wall motion abnormalities. The left ventricular internal cavity size was normal in size. There is no left ventricular hypertrophy. Left ventricular diastolic parameters were normal. Right Ventricle: The right ventricular size is normal. No increase in right ventricular wall thickness. Right ventricular systolic function is normal. There is severely elevated pulmonary artery systolic pressure. The tricuspid regurgitant velocity is 3.66 m/s, and with an assumed right atrial pressure of 10 mmHg, the estimated right ventricular systolic pressure is 65.7 mmHg. Left Atrium: Left atrial size was severely dilated. Right Atrium: Right atrial size was mild to moderately dilated. Pericardium: There is no evidence of pericardial effusion. Mitral Valve: The mitral valve is normal in structure. Trivial mitral valve regurgitation. Tricuspid Valve: The tricuspid valve is normal in structure. Tricuspid valve regurgitation is mild. Aortic Valve: The aortic valve is tricuspid. Aortic valve regurgitation is not visualized. Mild to moderate aortic valve sclerosis/calcification is present, without any evidence of aortic stenosis. Pulmonic Valve: The pulmonic valve was not well visualized. Pulmonic valve regurgitation is not visualized. Aorta: The aortic root is normal in size and structure. Venous: The inferior vena cava is dilated in size with greater than 50% respiratory variability, suggesting right atrial pressure of 8 mmHg. IAS/Shunts: No atrial level shunt detected by color flow Doppler.  LEFT VENTRICLE PLAX 2D LVIDd:         3.81 cm  Diastology LVIDs:         1.85 cm  LV e' lateral:   10.80 cm/s  LV PW:         0.96 cm  LV E/e' lateral: 7.4 LV IVS:        0.96 cm  LV e' medial:    8.49 cm/s LVOT diam:     1.80 cm  LV E/e' medial:  9.4 LVOT Area:     2.54 cm  RIGHT VENTRICLE RV S prime:     14.90 cm/s TAPSE (M-mode): 2.3 cm LEFT ATRIUM             Index       RIGHT ATRIUM           Index LA diam:        4.40 cm 3.25 cm/m  RA Area:     18.50 cm LA Vol (A2C):   98.8 ml 73.07 ml/m RA Volume:   55.90 ml  41.34 ml/m LA Vol (A4C):   74.1 ml 54.80 ml/m LA Biplane Vol: 89.8 ml 66.41 ml/m   AORTA Ao Root diam: 2.70 cm MITRAL VALVE               TRICUSPID VALVE MV Area (PHT): 3.53 cm    TR Peak grad:   53.6 mmHg MV Decel Time: 215 msec    TR Vmax:        366.00 cm/s MV E velocity: 80.00 cm/s MV A velocity: 42.20 cm/s  SHUNTS MV E/A ratio:  1.90        Systemic Diam: 1.80 cm Dorris Carnes MD Electronically signed by Dorris Carnes MD Signature Date/Time: 10/11/2019/3:34:18 PM    Final     Microbiology No results found for this or any previous visit (from the past 240 hour(s)).  Lab Basic Metabolic Panel: No results for input(s): NA, K, CL, CO2, GLUCOSE, BUN, CREATININE, CALCIUM, MG, PHOS in the last 168 hours. Liver Function Tests: No results for input(s): AST, ALT, ALKPHOS, BILITOT, PROT, ALBUMIN in the last 168 hours. No results for input(s): LIPASE, AMYLASE in the last 168 hours. No results for input(s): AMMONIA in the last 168 hours. CBC: No results for input(s): WBC, NEUTROABS, HGB, HCT, MCV, PLT in the last 168 hours. Cardiac Enzymes: No results for input(s): CKTOTAL, CKMB, CKMBINDEX, TROPONINI in the last 168 hours. Sepsis Labs: No results for input(s): PROCALCITON, WBC, LATICACIDVEN in the last 168 hours.  Procedures/Operations     Shanon Brow Parlee Amescua 10/24/2019, 5:33 PM

## 2019-11-07 NOTE — Progress Notes (Signed)
PROGRESS NOTE  Sheri Espinoza VQQ:[id number] DOB: October 26, 1950 DOA: 11/02/2019 PCP: Josem Kaufmann, MD  Brief History: y.o.femalewith medical history ofanxiety/depression, rheumatoid arthritis, diverticulosis, hypertension, COPD, depression presenting with failure to thrive symptoms for the past 2 weeks. History is supplemented from the patient's spouse at the bedside. He states that the patient has had fatigue, decline in functional status, decreased oral intake for the better part of the month. She has lost 15 pounds in the past month. He states that the patient previously was able to perform all her ADLs without any assistive devices. However in the past 2 weeks, she has had a significant functional decline resulting in requiring assistance with her activities of daily living and making transfers. The patient has been having loose stools that has been melanotic. Apparently, the patient has had a history of loose stools in the past. It is unclear the duration of her melanotic stools. There is no hematochezia. There is no history of fevers, chills, chest pain, shortness breath, coughing, hemoptysis, abdominal pain, dysuria, hematuria. However, the patient has had decreased oral intake and unsteady gait and generalized weakness. There is no headaches, visual disturbance, syncope. She has not been placed on any antibiotics recently. She takes methotrexate every Friday for her rheumatoid arthritis. She continues to smoke 1 pack/day with a 40-pack-year history. According to the patient's spouse, patient has not been on any new medications although she does take Imodium on a as needed basis. The patient's family called EMS on the evening of 6-21. However, the patient refused to go to the emergency department. She went to see her PCP on the morning of 10/09/2019. Routine blood work was performed and showed WBC 20,000 and hemoglobin 7. The patient was contacted and directed to go to  the emergency department. In the emergency department, the patient was afebrile with soft blood pressures. Systolic blood pressures were in the mid 90s. Oxygen saturation was 98% on 4 L. Chest x-ray showed increased interstitial markings. BMP showed sodium 139, potassium 3.7, CO2 24, serum creatinine 0.34. WBC 20.0, hemoglobin 6.9, platelets 172,000. Hemoccult was positive with brown/dark stool. EKG shows sinus rhythm with nonspecific T wave changes.On the evening 10/12/19, patient's condition declined. She became more sob and required BiPAP. Her WBC count count continued to rise despite abx.  Her abx coverage was broadened and steroids were added.  Multiple goals of care discussions were held with patient's spouse and daughter.  They were updated on patient's clinical situation.  We discussed her overall poor prognosis in the setting of the patient's co-morbidities, frailty and current critical illness.  Ultimately, the family did not want the patient to suffer.  They wanted to change her focus of care to be directed soley on her sole comfort.   Subsequently, medications with curative intent were discontinued and further diagnostic studies were discontinued.  Further care was directed soley on her comfort.   Assessment/Plan: Sepsis -present on admission -due to pneumonia vs urine -Follow bloodand urinecultures -Check lactic acid1,4 -Check procalcitonin0.16>>0.33 -UA--11-20 WBC -continueempiric cefepime-->ceftriaxone -abx coverage broadened to zosyn intially with decompensation -add azithro -continue IVF>>saline lock -WBC slowly increasing -repeat blood cultures--neg to date -after goals of care discussions with family, they wanted to change her focus of care to be directed soley on her sole comfort.   Acute blood loss anemia -No prior records to clarify the patient's hemoglobin baseline -GI consultappreciated-->possible endoscopy -Transfused 2 unitsPRBCs-->hgb remains  stable -Continue Protonix drip -Check coags--INR  1.2, PTT 40 -no longer active issue as patient is now full comfort care  Acute respiratory failure with hypoxia -due topneumonia in setting ofunderlying COPD -CT chest--diffuse airspace disease RUL; patchy GGO LUL and LLL -respiratory status worsen overnight-->BiPAP -wean forsaturation 90-92% -repeat CXR--increasing infiltrates -check ABG--7.293/42/58/19 (0.5) -follow sputum culture--pseudomonas  -after goals of care discussions with family, they wanted to change her focus of care to be directed soley on her sole comfort.   Acute diastolic CHF -primarily cor pulmonale -10/11/19 Echo--EF >75%; severely elevated PASP -continue IV furosemide -daily weight -accurate I/Os -after goals of care discussions with family, they wanted to change her focus of care to be directed soley on her sole comfort.   COPD exacerbation -started IV solumedrol -started duoneb -started pulmicort -after goals of care discussions with family, they wanted to change her focus of care to be directed soley on her sole comfort.   Right leg cellulitis -continue abx as above --after goals of care discussions with family, they wanted to change her focus of care to be directed soley on her sole comfort.   Diarrhea -check Cdiff--neg  Failure to thrive -Serum G38>7564 -Folic PPIR--51.8 -ACZ6.606 -PT eval->SNF  Left lower extremity ulceration -Wound care consultappreciated -See pictureson 11/01/2019 -venous duplex--neg for DVT  Thrombocytosis -Due to combination of acute phase reaction as well as likely iron deficiency -Check iron studies--iron sat 3%, ferritin 47 -replace iron once GI evaluation is finished  Rheumatoid arthritis -Holding methotrexate in the setting of Sepsis  Hyperthyroidism -TSH 0.071 -Free T4 1.20 -check thyroperoxidase and thyroglobulin ab -outpt endocrine referral  COPD/tobacco  abuse -continueduonebs -continuepulmicort  HTN -holding HCTZ/irbesartan  Hyperlipidemia -restart statin  Goals of Care -discussed with spouse 6/6 -Advance care planning, including the explanation and discussion of advance directives was carried out with the patient and family. Code status including explanations of "Full Code" and "DNR" and alternatives were discussed in detail. Discussion of end-of-life issues including but not limited palliative care, hospice care and the concept of hospice, other end-of-life care options, power of attorney for health care decisions, living wills, and physician orders for life-sustaining treatment were also discussed with the patient and family. Total face to face time 16 minutes. -spouse wishes for FULL CODE -discussed pt's clinical decline and overall poor prognosis in setting of patient's frailty and multiple comorbidities       Status is: Inpatient  Remains inpatient appropriate because:IV treatments appropriate due to intensity of illness or inability to take PO -fluid overloaded on IV lasix; requires IV abx;    Dispo: The patient is from:Home Anticipated d/c is TK:ZSWFUXNA death Anticipated d/c date is:n/a Patient currentlyis not medically stable to d/c.        Family Communication:Spouse updatedat bedside 6/8  Consultants:GI  Code Status: FULL   DVT Prophylaxis: FULL COMFORT   Procedures: As Listed in Progress Note Above  Antibiotics: Ceftriaxone 6/5>>>6/6 Cefepime 6/3>>>6/5 Azithro6/4>>>6/6 vanc6/3      Subjective: Pt is resting comfortably.  No respiratory distress. No vomiting.  No signs of uncontrolled pain.    Objective: Vitals:   10/14/19 1452 10/14/19 1710 10/14/19 1819 11-07-2019 0000  BP:      Pulse: 88 90 85   Resp: 18 17 17    Temp:    98 F (36.7 C)  TempSrc:    Axillary  SpO2: 93% 94% 94%   Weight:       Height:        Intake/Output Summary (Last 24 hours) at 11/07/2019 3557 Last data  filed at 10/14/2019 1839 Gross per 24 hour  Intake 0 ml  Output 600 ml  Net -600 ml   Weight change:  Exam:   General:  Pt is resting peacefully.  not in acute distress  HEENT: No icterus, No thrush, [place]/AT  Cardiovascular: RRR, S1/S2, no rubs,  Respiratory: bibasilar rales.  No wheeze  Abdomen: Soft/+BS, non distended, no guarding  Extremities: No edema, No lymphangitis, No petechiae, No rashes, no synovitis   Data Reviewed: I have personally reviewed following labs and imaging studies Basic Metabolic Panel: Recent Labs  Lab 10/28/2019 1042 10/11/19 0441 10/12/19 0448 10/13/19 0301  NA 139 143 145 145  K 3.7 3.8 4.1 4.1  CL 104 114* 117* 117*  CO2 24 21* 19* 18*  GLUCOSE 87 68* 104* 135*  BUN 27* 26* 33* 44*  CREATININE 0.34* 0.32* 0.39* 0.65  CALCIUM 8.1* 7.3* 7.4* 7.3*  MG  --   --  1.7 2.2   Liver Function Tests: Recent Labs  Lab 11/01/2019 1042 10/11/19 0441  AST 34 31  ALT 32 28  ALKPHOS 92 84  BILITOT 0.7 0.9  PROT 5.3* 4.7*  ALBUMIN 2.4* 2.0*   No results for input(s): LIPASE, AMYLASE in the last 168 hours. No results for input(s): AMMONIA in the last 168 hours. Coagulation Profile: Recent Labs  Lab 10/24/2019 1343  INR 1.2   CBC: Recent Labs  Lab 10/27/2019 1042 10/11/19 0441 10/12/19 0448 10/13/19 0301  WBC 20.2* 18.3* 23.2* 24.4*  NEUTROABS 18.9*  --   --   --   HGB 6.9* 9.0* 10.1* 10.6*  HCT 20.5* 26.9* 31.0* 32.8*  MCV 94.5 92.1 93.1 92.4  PLT 472* 381 494* 503*   Cardiac Enzymes: No results for input(s): CKTOTAL, CKMB, CKMBINDEX, TROPONINI in the last 168 hours. BNP: Invalid input(s): POCBNP CBG: No results for input(s): GLUCAP in the last 168 hours. HbA1C: No results for input(s): HGBA1C in the last 72 hours. Urine analysis:    Component Value Date/Time   COLORURINE AMBER (A) 10/28/2019 1336   APPEARANCEUR HAZY (A) 10/23/2019 1336   LABSPEC  1.023 10/09/2019 1336   PHURINE 5.0 10/30/2019 1336   GLUCOSEU NEGATIVE 11/01/2019 1336   HGBUR NEGATIVE 10/27/2019 1336   BILIRUBINUR NEGATIVE 10/18/2019 1336   KETONESUR NEGATIVE 10/12/2019 1336   PROTEINUR NEGATIVE 10/13/2019 1336   NITRITE POSITIVE (A) 10/31/2019 1336   LEUKOCYTESUR NEGATIVE 10/21/2019 1336   Sepsis Labs: @LABRCNTIP (procalcitonin:4,lacticidven:4) ) Recent Results (from the past 240 hour(s))  Culture, blood (single) w Reflex to ID Panel     Status: None (Preliminary result)   Collection Time: 10/26/2019 10:59 AM   Specimen: BLOOD RIGHT FOREARM  Result Value Ref Range Status   Specimen Description BLOOD RIGHT FOREARM  Final   Special Requests   Final    BOTTLES DRAWN AEROBIC AND ANAEROBIC Blood Culture adequate volume   Culture   Final    NO GROWTH 4 DAYS Performed at [facility], [address], [place], [place] [address]    Report Status PENDING  Incomplete  SARS Coronavirus 2 by RT PCR (hospital order, performed in [place] hospital lab) Nasopharyngeal Nasopharyngeal Swab     Status: None   Collection Time: 11/02/2019  1:35 PM   Specimen: Nasopharyngeal Swab  Result Value Ref Range Status   SARS Coronavirus 2 NEGATIVE NEGATIVE Final    Comment: (NOTE) SARS-CoV-2 target nucleic acids are NOT DETECTED. The SARS-CoV-2 RNA is generally detectable in upper and lower respiratory specimens during the acute  phase of infection. The lowest concentration of SARS-CoV-2 viral copies this assay can detect is 250 copies / mL. A negative result does not preclude SARS-CoV-2 infection and should not be used as the sole basis for treatment or other patient management decisions.  A negative result may occur with improper specimen collection / handling, submission of specimen other than nasopharyngeal swab, presence of viral mutation(s) within the areas targeted by this assay, and inadequate number of viral copies (<250 copies / mL). A negative result must be combined with  clinical observations, patient history, and epidemiological information. Fact Sheet for Patients:   StrictlyIdeas.no Fact Sheet for Healthcare Providers: BankingDealers.co.za This test is not yet approved or cleared  by the [place] FDA and has been authorized for detection and/or diagnosis of SARS-CoV-2 by FDA under an Emergency Use Authorization (EUA).  This EUA will remain in effect (meaning this test can be used) for the duration of the COVID-19 declaration under Section 564(b)(1) of the Act, 21 U.S.C. section 360bbb-3(b)(1), unless the authorization is terminated or revoked sooner. Performed at [facility], [address] [place], [place] [address]   Culture, Urine     Status: Abnormal   Collection Time: 10/12/2019  1:36 PM   Specimen: Urine, Clean Catch  Result Value Ref Range Status   Specimen Description   Final    URINE, CLEAN CATCH Performed at [facility], [address], [place], [place] [address]    Special Requests   Final    NONE Performed at [facility], [address] [place], [place] [address]    Culture >=100,000 COLONIES/mL KLEBSIELLA PNEUMONIAE (A)  Final   Report Status 10/12/2019 FINAL  Final   Organism ID, Bacteria KLEBSIELLA PNEUMONIAE (A)  Final      Susceptibility   Klebsiella pneumoniae - MIC*    AMPICILLIN RESISTANT Resistant     CEFAZOLIN <=4 SENSITIVE Sensitive     CEFTRIAXONE <=1 SENSITIVE Sensitive     CIPROFLOXACIN <=0.25 SENSITIVE Sensitive     GENTAMICIN <=1 SENSITIVE Sensitive     IMIPENEM <=0.25 SENSITIVE Sensitive     NITROFURANTOIN <=16 SENSITIVE Sensitive     TRIMETH/SULFA <=20 SENSITIVE Sensitive     AMPICILLIN/SULBACTAM <=2 SENSITIVE Sensitive     PIP/TAZO <=4 SENSITIVE Sensitive     * >=100,000 COLONIES/mL KLEBSIELLA PNEUMONIAE  MRSA PCR Screening     Status: None   Collection Time: 10/24/2019  1:45 PM   Specimen: Nasal Mucosa; Nasopharyngeal  Result Value Ref Range Status    MRSA by PCR NEGATIVE NEGATIVE Final    Comment:        The GeneXpert MRSA Assay (FDA approved for NASAL specimens only), is one component of a comprehensive MRSA colonization surveillance program. It is not intended to diagnose MRSA infection nor to guide or monitor treatment for MRSA infections. Performed at [facility], [address] [place], [place] [address]   Expectorated sputum assessment w rflx to resp cult     Status: None   Collection Time: 10/12/19  4:30 PM   Specimen: Expectorated Sputum  Result Value Ref Range Status   Specimen Description EXPECTORATED SPUTUM  Final   Special Requests NONE  Final   Sputum evaluation   Final    THIS SPECIMEN IS ACCEPTABLE FOR SPUTUM CULTURE Performed at [facility], [address], [place], [place] [address]    Report Status 10/12/2019 FINAL  Final  Culture, respiratory     Status: None (Preliminary result)   Collection Time: 10/12/19  4:30  PM  Result Value Ref Range Status   Specimen Description   Final    EXPECTORATED SPUTUM Performed at [facility], [address] [place], [place] [address]    Special Requests   Final    NONE Reflexed from [id number] Performed at [facility], [address], [place], [place] [address]    Gram Stain   Final    RARE WBC PRESENT, PREDOMINANTLY PMN RARE YEAST RARE GRAM POSITIVE COCCI    Culture   Final    ABUNDANT YEAST RARE PSEUDOMONAS AERUGINOSA CULTURE REINCUBATED FOR BETTER GROWTH Performed at [facility], [place] [address], [place], [place] [address]    Report Status PENDING  Incomplete  C Difficile Quick Screen w PCR reflex     Status: None   Collection Time: 10/12/19  7:23 PM   Specimen: STOOL  Result Value Ref Range Status   C Diff antigen NEGATIVE NEGATIVE Final   C Diff toxin NEGATIVE NEGATIVE Final   C Diff interpretation No C. difficile detected.  Final    Comment: Performed at [facility], [address], [place], [place] [address]  Culture, blood (Routine X 2) w Reflex  to ID Panel     Status: None (Preliminary result)   Collection Time: 10/13/19 10:35 AM   Specimen: BLOOD LEFT WRIST  Result Value Ref Range Status   Specimen Description BLOOD LEFT WRIST BOTTLES DRAWN AEROBIC ONLY  Final   Special Requests Blood Culture adequate volume  Final   Culture   Final    NO GROWTH < 24 HOURS Performed at [facility], [address] [place], [place] [address]    Report Status PENDING  Incomplete  Culture, blood (Routine X 2) w Reflex to ID Panel     Status: None (Preliminary result)   Collection Time: 10/13/19 10:48 AM   Specimen: BLOOD  Result Value Ref Range Status   Specimen Description BLOOD  Final   Special Requests NONE  Final   Culture   Final    NO GROWTH < 24 HOURS Performed at [facility], [address], [place],  [address]    Report Status PENDING  Incomplete     Scheduled Meds: . mouth rinse  15 mL Mouth Rinse q12n4p   Continuous Infusions:  Procedures/Studies: DG Chest 1 View  Result Date: 10/13/2019 CLINICAL DATA:  Respiratory distress. History of asthma and hypertension. EXAM: CHEST  1 VIEW COMPARISON:  11/01/2019 FINDINGS: Patchy opacities are identified throughout the lungs bilaterally, slightly increased compared to prior. There is developing LEFT LOWER lobe consolidation. IMPRESSION: 1. Increased bilateral airspace opacities. 2. Developing LEFT LOWER lobe consolidation. Electronically Signed   By: Nolon Nations M.D.   On: 10/13/2019 10:57   DG Chest 2 View  Result Date: 10/17/2019 CLINICAL DATA:  Weakness. EXAM: CHEST - 2 VIEW COMPARISON:  None. FINDINGS: The heart size and mediastinal contours are within normal limits. Atherosclerosis of thoracic aorta is noted. No pneumothorax is noted. Central pulmonary vascular congestion is noted with possible bilateral pulmonary edema. Small pleural effusions are noted. The visualized skeletal structures are unremarkable. IMPRESSION: Central pulmonary vascular congestion may be present  with possible bilateral pulmonary edema and small pleural effusions. Electronically Signed   By: Marijo Conception M.D.   On: 10/20/2019 11:50   CT CHEST WO CONTRAST  Result Date: 10/31/2019 CLINICAL DATA:  Shortness of breath EXAM: CT CHEST WITHOUT CONTRAST TECHNIQUE: Multidetector CT imaging of the chest was performed following the standard protocol without IV contrast.  COMPARISON:  Chest x-ray today FINDINGS: Cardiovascular: Heart is normal size. Coronary artery and aortic atherosclerosis. No evidence of aortic aneurysm. Mediastinum/Nodes: No visible adenopathy. Trachea and esophagus are unremarkable. Thyroid unremarkable. Lungs/Pleura: Small bilateral pleural effusions. Diffuse airspace disease throughout much of the right upper lobe. Patchy ground-glass airspace opacities throughout the left upper lobe with more confluent opacity in the left lower lobe. Findings concerning for pneumonia. Underlying emphysema. Upper Abdomen: Imaging into the upper abdomen shows no acute findings. Musculoskeletal: Chest wall soft tissues are unremarkable. No acute bony abnormality. IMPRESSION: Multifocal airspace disease, most confluent in the right upper lobe and left lower lobe most compatible with multifocal pneumonia. Small bilateral pleural effusions. Coronary artery disease. Aortic Atherosclerosis (ICD10-I70.0) and Emphysema (ICD10-J43.9). Electronically Signed   By: Rolm Baptise M.D.   On: 10/14/2019 20:38   US Venous Img Lower Bilateral (DVT)  Result Date: 10/11/2019 CLINICAL DATA:  Shortness of breath, lower extremity edema, pain EXAM: BILATERAL LOWER EXTREMITY VENOUS DOPPLER ULTRASOUND TECHNIQUE: Gray-scale sonography with graded compression, as well as color Doppler and duplex ultrasound were performed to evaluate the lower extremity deep venous systems from the level of the common femoral vein and including the common femoral, femoral, profunda femoral, popliteal and calf veins including the posterior tibial,  peroneal and gastrocnemius veins when visible. The superficial great saphenous vein was also interrogated. Spectral Doppler was utilized to evaluate flow at rest and with distal augmentation maneuvers in the common femoral, femoral and popliteal veins. COMPARISON:  None. FINDINGS: RIGHT LOWER EXTREMITY Common Femoral Vein: No evidence of thrombus. Normal compressibility, respiratory phasicity and response to augmentation. Saphenofemoral Junction: No evidence of thrombus. Normal compressibility and flow on color Doppler imaging. Profunda Femoral Vein: No evidence of thrombus. Normal compressibility and flow on color Doppler imaging. Femoral Vein: No evidence of thrombus. Normal compressibility, respiratory phasicity and response to augmentation. Popliteal Vein: No evidence of thrombus. Normal compressibility, respiratory phasicity and response to augmentation. Calf Veins: No evidence of thrombus. Normal compressibility and flow on color Doppler imaging. LEFT LOWER EXTREMITY Common Femoral Vein: No evidence of thrombus. Normal compressibility, respiratory phasicity and response to augmentation. Saphenofemoral Junction: No evidence of thrombus. Normal compressibility and flow on color Doppler imaging. Profunda Femoral Vein: No evidence of thrombus. Normal compressibility and flow on color Doppler imaging. Femoral Vein: No evidence of thrombus. Normal compressibility, respiratory phasicity and response to augmentation. Popliteal Vein: No evidence of thrombus. Normal compressibility, respiratory phasicity and response to augmentation. Calf Veins: No evidence of thrombus. Normal compressibility and flow on color Doppler imaging. Other Findings: Left popliteal fossa thick-walled cystic structure without vascular flow measures 5.1 x 1.2 x 3.0 cm compatible with a Baker's cyst. IMPRESSION: No evidence of deep venous thrombosis in either lower extremity. Electronically Signed   By: Jerilynn Mages.  Shick M.D.   On: 10/11/2019 13:23    ECHOCARDIOGRAM COMPLETE  Result Date: 10/11/2019    ECHOCARDIOGRAM REPORT   Patient Name:   YEXALEN DEIKE Date of Exam: 10/11/2019 Medical Rec #:  [id number]       Height:       59.0 in Accession #:    [id number]      Weight:       96.3 lb Date of Birth:  1950-12-30        BSA:          1.352 m Patient Age:    69 years        BP:           122/65  mmHg Patient Gender: F               HR:           52 bpm. Exam Location:  [facility] Procedure: 2D Echo Indications:    Dyspnea 786.09 / R06.00  History:        Patient has no prior history of Echocardiogram examinations.                 Risk Factors:Current Smoker. Lobar pneumonia , SIRS (systemic                 inflammatory response syndrome, Anemia.  Sonographer:    Leavy Cella RDCS (AE) Referring Phys: [phone] Nyla Creason IMPRESSIONS  1. Left ventricular ejection fraction, by estimation, is >75%. The left ventricle has hyperdynamic function. The left ventricle has no regional wall motion abnormalities. Left ventricular diastolic parameters were normal.  2. Right ventricular systolic function is normal. The right ventricular size is normal. There is severely elevated pulmonary artery systolic pressure.  3. Left atrial size was severely dilated.  4. Right atrial size was mild to moderately dilated.  5. The mitral valve is normal in structure. Trivial mitral valve regurgitation.  6. The aortic valve is tricuspid. Aortic valve regurgitation is not visualized. Mild to moderate aortic valve sclerosis/calcification is present, without any evidence of aortic stenosis.  7. The inferior vena cava is dilated in size with >50% respiratory variability, suggesting right atrial pressure of 8 mmHg. FINDINGS  Left Ventricle: Left ventricular ejection fraction, by estimation, is >75%. The left ventricle has hyperdynamic function. The left ventricle has no regional wall motion abnormalities. The left ventricular internal cavity size was normal in size. There is no left ventricular  hypertrophy. Left ventricular diastolic parameters were normal. Right Ventricle: The right ventricular size is normal. No increase in right ventricular wall thickness. Right ventricular systolic function is normal. There is severely elevated pulmonary artery systolic pressure. The tricuspid regurgitant velocity is 3.66 m/s, and with an assumed right atrial pressure of 10 mmHg, the estimated right ventricular systolic pressure is 51.0 mmHg. Left Atrium: Left atrial size was severely dilated. Right Atrium: Right atrial size was mild to moderately dilated. Pericardium: There is no evidence of pericardial effusion. Mitral Valve: The mitral valve is normal in structure. Trivial mitral valve regurgitation. Tricuspid Valve: The tricuspid valve is normal in structure. Tricuspid valve regurgitation is mild. Aortic Valve: The aortic valve is tricuspid. Aortic valve regurgitation is not visualized. Mild to moderate aortic valve sclerosis/calcification is present, without any evidence of aortic stenosis. Pulmonic Valve: The pulmonic valve was not well visualized. Pulmonic valve regurgitation is not visualized. Aorta: The aortic root is normal in size and structure. Venous: The inferior vena cava is dilated in size with greater than 50% respiratory variability, suggesting right atrial pressure of 8 mmHg. IAS/Shunts: No atrial level shunt detected by color flow Doppler.  LEFT VENTRICLE PLAX 2D LVIDd:         3.81 cm  Diastology LVIDs:         1.85 cm  LV e' lateral:   10.80 cm/s LV PW:         0.96 cm  LV E/e' lateral: 7.4 LV IVS:        0.96 cm  LV e' medial:    8.49 cm/s LVOT diam:     1.80 cm  LV E/e' medial:  9.4 LVOT Area:     2.54 cm  RIGHT VENTRICLE RV S prime:  14.90 cm/s TAPSE (M-mode): 2.3 cm LEFT ATRIUM             Index       RIGHT ATRIUM           Index LA diam:        4.40 cm 3.25 cm/m  RA Area:     18.50 cm LA Vol (A2C):   98.8 ml 73.07 ml/m RA Volume:   55.90 ml  41.34 ml/m LA Vol (A4C):   74.1 ml 54.80  ml/m LA Biplane Vol: 89.8 ml 66.41 ml/m   AORTA Ao Root diam: 2.70 cm MITRAL VALVE               TRICUSPID VALVE MV Area (PHT): 3.53 cm    TR Peak grad:   53.6 mmHg MV Decel Time: 215 msec    TR Vmax:        366.00 cm/s MV E velocity: 80.00 cm/s MV A velocity: 42.20 cm/s  SHUNTS MV E/A ratio:  1.90        Systemic Diam: 1.80 cm Dorris Carnes MD Electronically signed by Dorris Carnes MD Signature Date/Time: 10/11/2019/3:34:18 PM    Final     Orson Eva, DO  [facility]  If 7PM-7AM, please contact night-coverage [email or web address] Password [email or web address] 2019-11-10, 8:14 AM   LOS: 5 days

## 2019-11-07 NOTE — Progress Notes (Signed)
Family at bedside, patient resting comfortably. Per family request, patient given ordered Morphine every 2 hours this shift. No signs of pain or discomfort at this time. Will continue to monitor and offer support to family.

## 2019-11-07 NOTE — TOC Initial Note (Signed)
Transition of Care [facility]) - Initial/Assessment Note    Patient Details  Name: Sheri Espinoza MRN: [id number] Date of Birth: [birth year]  [facility] CM/SW Contact:    Salome Arnt, LCSW Phone Number: 2019/11/08, 11:45 AM  Clinical Narrative:  LCSW spoke with pt's husband regarding GIP after discussing with MD. He is agreeable to referral. Anticipate in hospital death per MD. LCSW spoke with Cassandra at Hospice who will follow up with family.                 Expected Discharge Plan: (In hospital death anticipated)     Patient Goals and CMS Choice        Expected Discharge Plan and Services Expected Discharge Plan: (In hospital death anticipated) In-house Referral: Hospice / [place] arrangements for the past 2 months: Single Family Home                                      Prior Living Arrangements/Services Living arrangements for the past 2 months: Single Family Home Lives with:: Spouse Patient language and need for interpreter reviewed:: Yes              Criminal Activity/Legal Involvement Pertinent to Current Situation/Hospitalization: No - Comment as needed  Activities of Daily Living Home Assistive Devices/Equipment: Nebulizer ADL Screening (condition at time of admission) Patient's cognitive ability adequate to safely complete daily activities?: Yes Is the patient deaf or have difficulty hearing?: No Does the patient have difficulty seeing, even when wearing glasses/contacts?: No Does the patient have difficulty concentrating, remembering, or making decisions?: No Patient able to express need for assistance with ADLs?: Yes Does the patient have difficulty dressing or bathing?: Yes Independently performs ADLs?: No Communication: Independent Dressing (OT): Needs assistance Is this a change from baseline?: Change from baseline, expected to last >3 days Grooming: Needs assistance Is this a change from baseline?:  Change from baseline, expected to last >3 days Feeding: Independent Bathing: Needs assistance Is this a change from baseline?: Change from baseline, expected to last >3 days Toileting: Needs assistance Is this a change from baseline?: Change from baseline, expected to last >3days In/Out Bed: Needs assistance Is this a change from baseline?: Change from baseline, expected to last >3 days Walks in Home: Independent Does the patient have difficulty walking or climbing stairs?: No Weakness of Legs: Both Weakness of Arms/Hands: Both  Permission Sought/Granted   Permission granted to share information with : Yes, Verbal Permission Granted     Permission granted to share info w AGENCY: [facility]        Emotional Assessment   Attitude/Demeanor/Rapport: Unable to Assess Affect (typically observed): Unable to Assess        Admission diagnosis:  Failure to thrive in adult [R62.7] Sepsis due to undetermined organism [facility]) [A41.9] Gastrointestinal hemorrhage, unspecified gastrointestinal hemorrhage type [K92.2] Patient Active Problem List   Diagnosis Date Noted  . Respiratory distress   . Goals of care, counseling/discussion   . Acute diastolic CHF (congestive heart failure) ([place]) 10/12/2019  . Lobar pneumonia ([place]) 10/11/2019  . Sepsis due to undetermined organism ([place]) 10/27/2019  . Acute blood loss anemia 10/13/2019  . SIRS (systemic inflammatory response syndrome) ([place]) 10/14/2019  . Thrombocytosis ([place]) 10/29/2019  . Acute respiratory failure with hypoxia ([place]) 10/22/2019  . Failure to thrive in adult   . Gastrointestinal hemorrhage   .  [place]    PCP:  Josem Kaufmann, MD Pharmacy:   [place], [place] - [address] [place] [address] Phone: [phone] Fax: [phone]     Social Determinants of Health (SDOH) Interventions    Readmission Risk Interventions No flowsheet data found.

## 2019-11-07 DEATH — deceased

## 2022-02-28 IMAGING — DX DG CHEST 2V
2 series · 2 of 2 positions shown · non-contrast
Comparison: None.

CLINICAL DATA: Weakness.

EXAM:
CHEST - 2 VIEW

[chest lat]
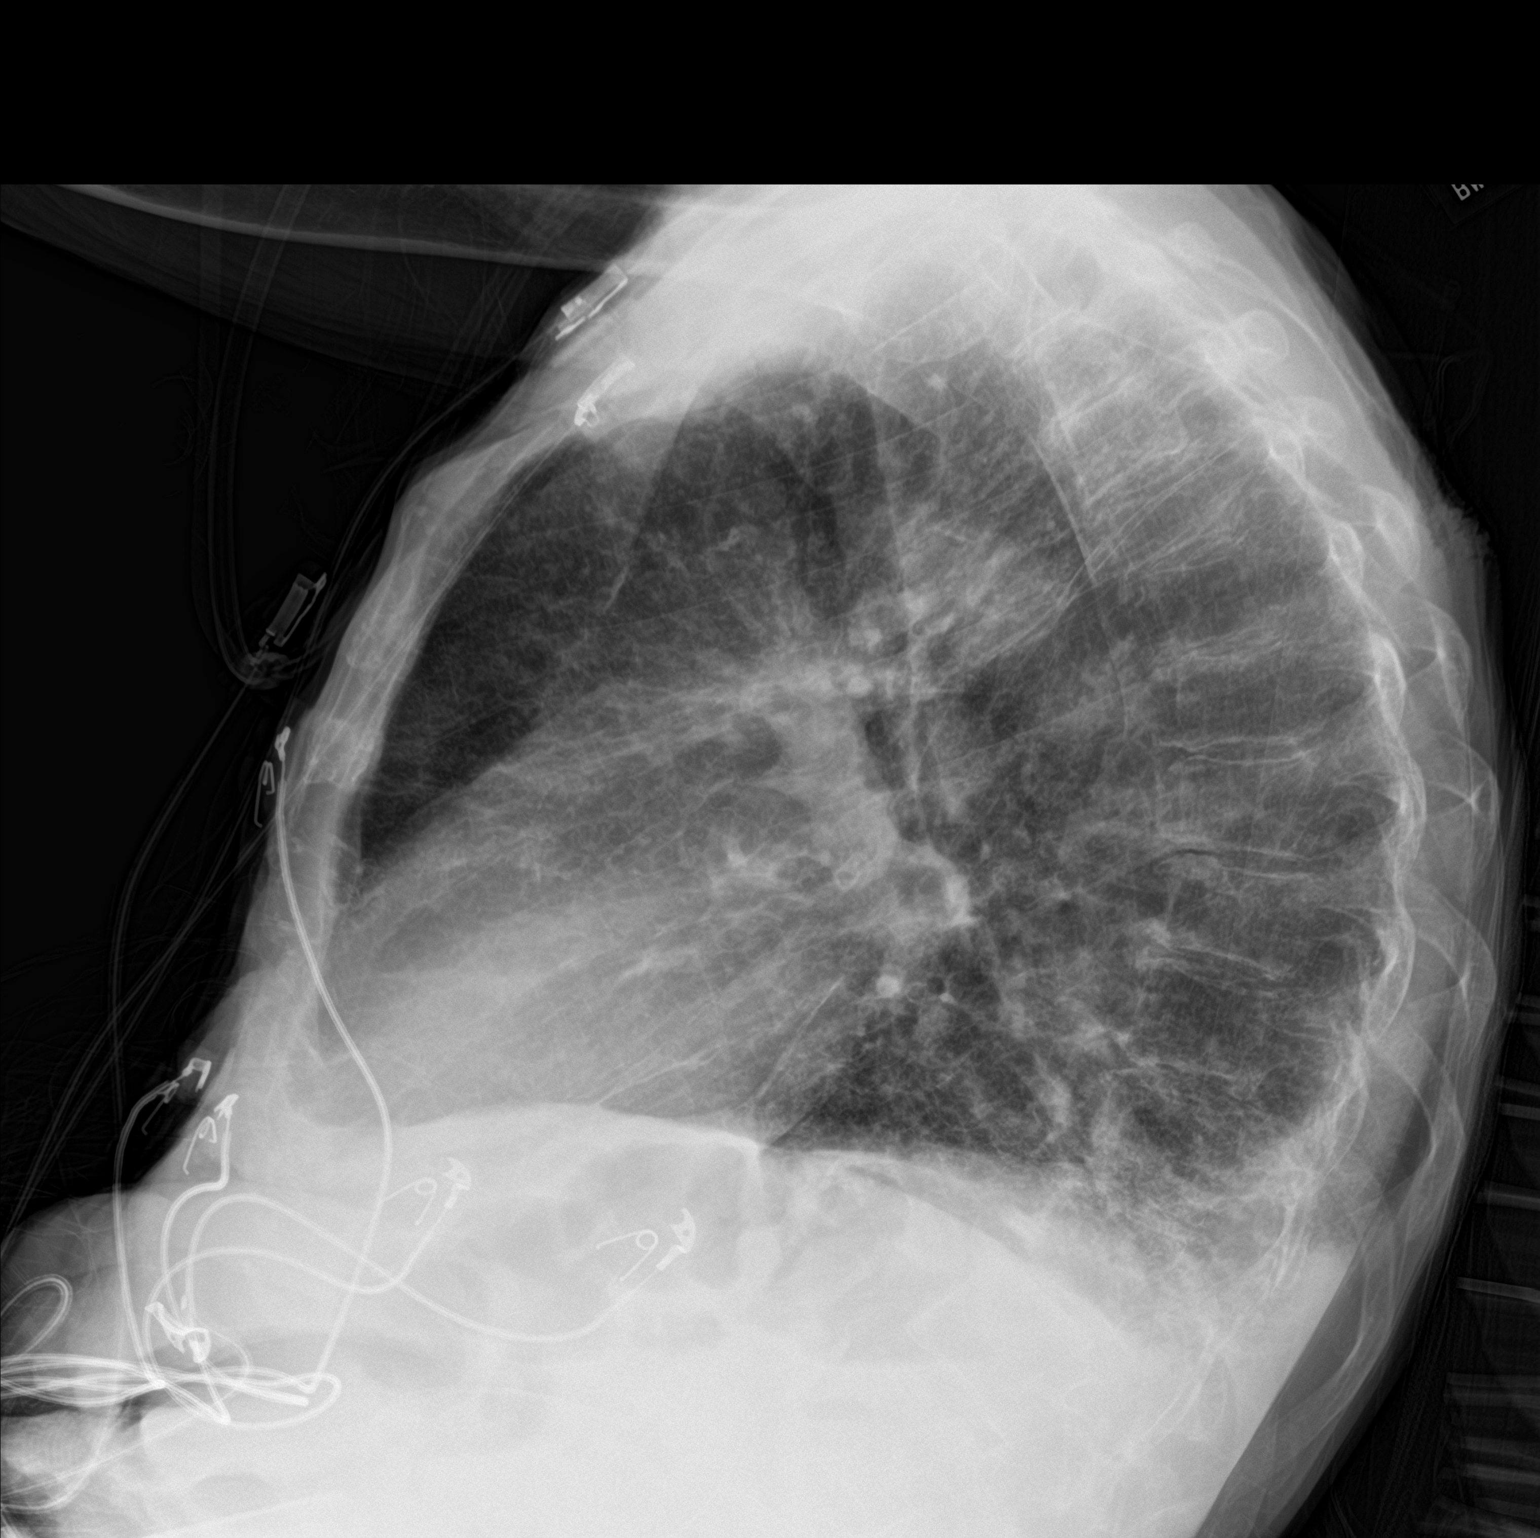

[chest ap]
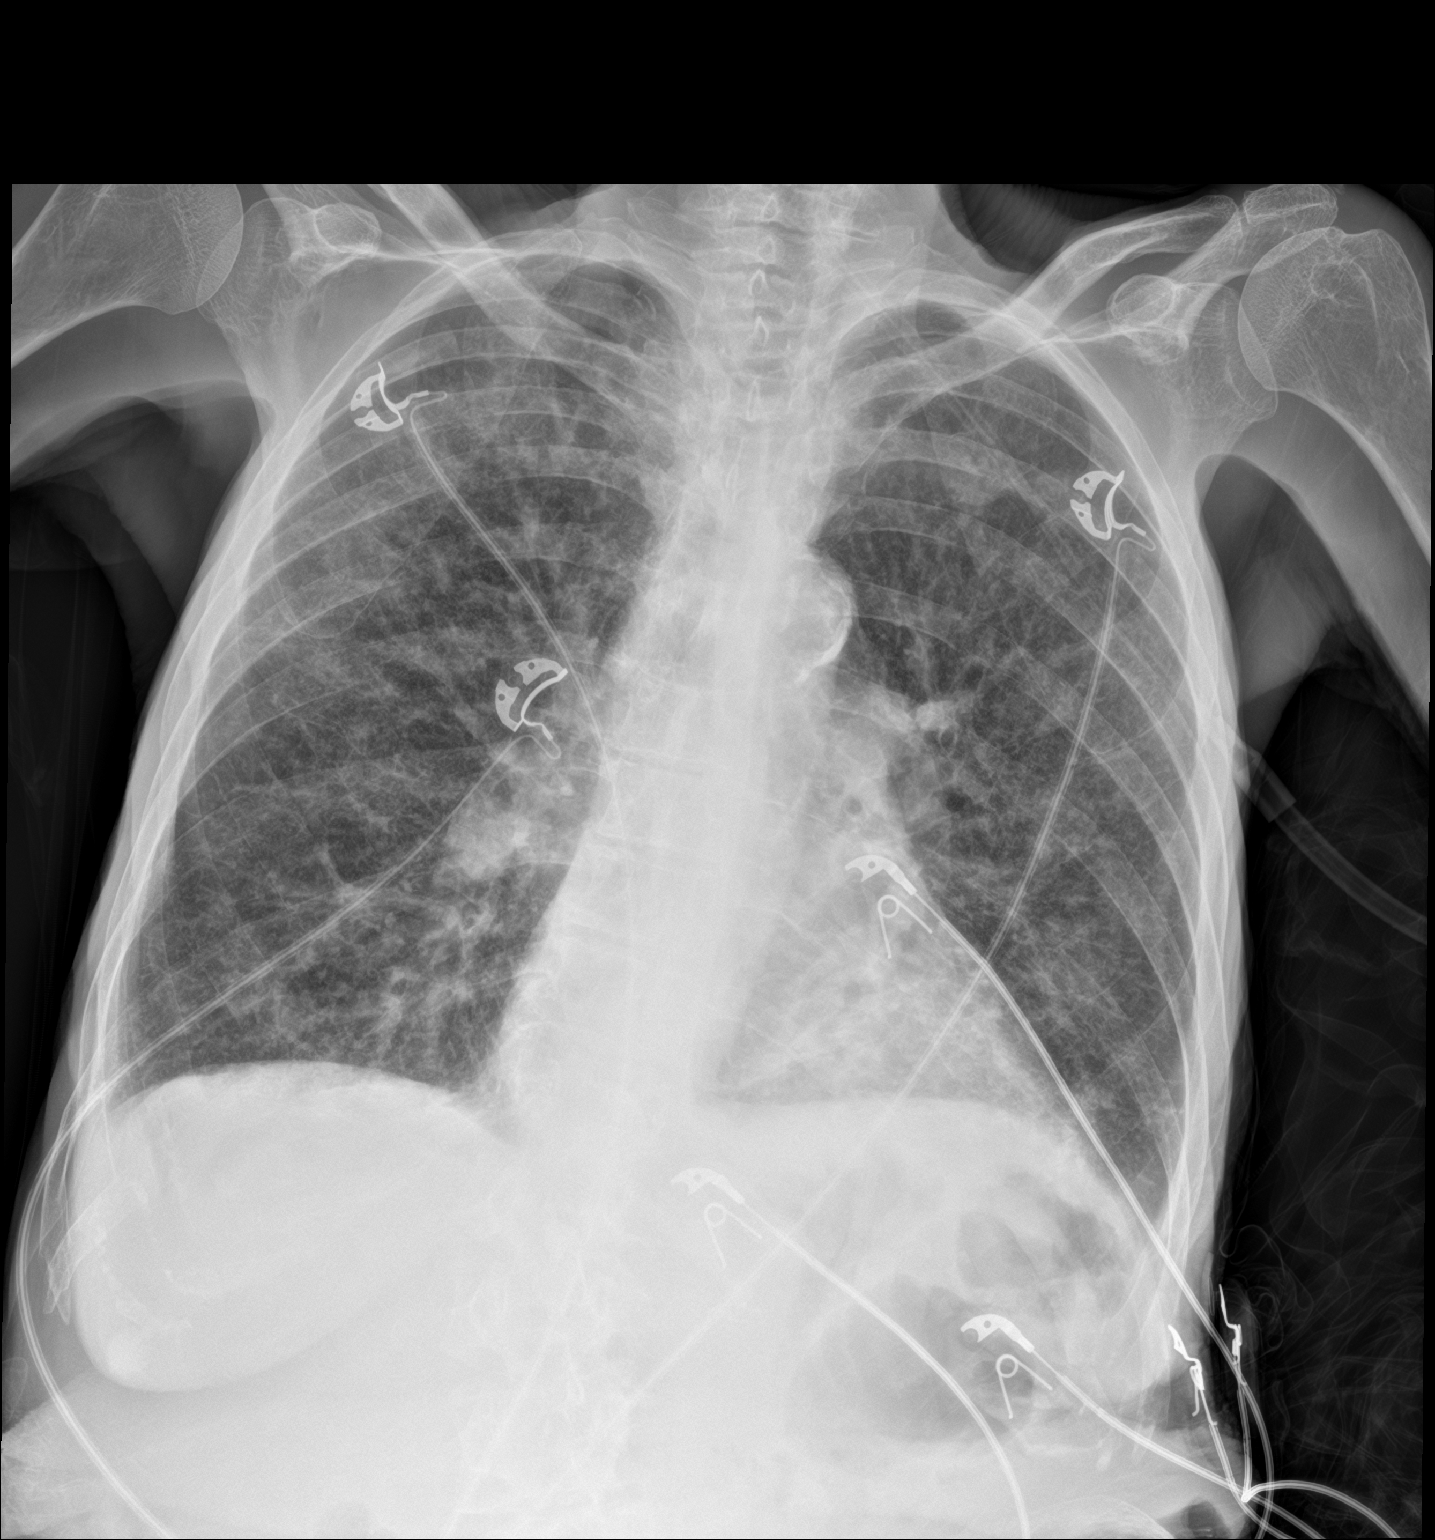

[2 of 2 positions shown; findings below may reference images not displayed]

FINDINGS: The heart size and mediastinal contours are within normal limits.
Atherosclerosis of thoracic aorta is noted. No pneumothorax is
noted. Central pulmonary vascular congestion is noted with possible
bilateral pulmonary edema. Small pleural effusions are noted. The
visualized skeletal structures are unremarkable.
IMPRESSION: Central pulmonary vascular congestion may be present with possible
bilateral pulmonary edema and small pleural effusions.
